# Patient Record
Sex: Female | Born: 1975 | Race: White | Hispanic: No | Marital: Married | Smoking: Never smoker
Health system: Southern US, Community
[De-identification: ages and names within clinical notes are randomized; demographics above are authoritative.]

## PROBLEM LIST (undated history)

## (undated) DIAGNOSIS — E039 Hypothyroidism, unspecified: Secondary | ICD-10-CM

## (undated) DIAGNOSIS — F909 Attention-deficit hyperactivity disorder, unspecified type: Secondary | ICD-10-CM

## (undated) DIAGNOSIS — G43909 Migraine, unspecified, not intractable, without status migrainosus: Secondary | ICD-10-CM

## (undated) DIAGNOSIS — D509 Iron deficiency anemia, unspecified: Secondary | ICD-10-CM

## (undated) DIAGNOSIS — Z8659 Personal history of other mental and behavioral disorders: Secondary | ICD-10-CM

## (undated) DIAGNOSIS — D039 Melanoma in situ, unspecified: Secondary | ICD-10-CM

## (undated) DIAGNOSIS — R519 Headache, unspecified: Secondary | ICD-10-CM

## (undated) DIAGNOSIS — O021 Missed abortion: Secondary | ICD-10-CM

## (undated) DIAGNOSIS — K219 Gastro-esophageal reflux disease without esophagitis: Secondary | ICD-10-CM

## (undated) DIAGNOSIS — Z8619 Personal history of other infectious and parasitic diseases: Secondary | ICD-10-CM

## (undated) DIAGNOSIS — R51 Headache: Secondary | ICD-10-CM

## (undated) HISTORY — DX: Personal history of other mental and behavioral disorders: Z86.59

## (undated) HISTORY — PX: WISDOM TOOTH EXTRACTION: SHX21

## (undated) HISTORY — DX: Iron deficiency anemia, unspecified: D50.9

## (undated) HISTORY — DX: Gastro-esophageal reflux disease without esophagitis: K21.9

## (undated) HISTORY — DX: Headache, unspecified: R51.9

## (undated) HISTORY — PX: RHINOPLASTY: SUR1284

## (undated) HISTORY — DX: Missed abortion: O02.1

## (undated) HISTORY — DX: Attention-deficit hyperactivity disorder, unspecified type: F90.9

## (undated) HISTORY — DX: Melanoma in situ, unspecified: D03.9

## (undated) HISTORY — DX: Hypothyroidism, unspecified: E03.9

## (undated) HISTORY — DX: Personal history of other infectious and parasitic diseases: Z86.19

## (undated) HISTORY — PX: TONSILLECTOMY AND ADENOIDECTOMY: SUR1326

## (undated) HISTORY — DX: Migraine, unspecified, not intractable, without status migrainosus: G43.909

## (undated) HISTORY — PX: PLACEMENT OF BREAST IMPLANTS: SHX6334

## (undated) HISTORY — DX: Headache: R51

---
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
## 2004-11-23 HISTORY — PX: AUGMENTATION MAMMAPLASTY: SUR837

## 2006-11-23 DIAGNOSIS — D039 Melanoma in situ, unspecified: Secondary | ICD-10-CM

## 2006-11-23 HISTORY — DX: Melanoma in situ, unspecified: D03.9

## 2011-11-24 LAB — HM MAMMOGRAPHY

## 2013-12-05 LAB — BASIC METABOLIC PANEL
BUN: 23 mg/dL — AB (ref 4–21)
CREATININE: 0.8 mg/dL (ref 0.5–1.1)
GLUCOSE: 88 mg/dL
POTASSIUM: 3.9 mmol/L (ref 3.4–5.3)
Sodium: 139 mmol/L (ref 137–147)

## 2013-12-05 LAB — HEPATIC FUNCTION PANEL
ALT: 18 U/L (ref 7–35)
AST: 8 U/L — AB (ref 13–35)
Alkaline Phosphatase: 40 U/L (ref 25–125)
Bilirubin, Total: 0.4 mg/dL

## 2014-03-05 LAB — HM PAP SMEAR

## 2014-03-08 ENCOUNTER — Encounter: Payer: Self-pay | Admitting: Internal Medicine

## 2014-03-30 ENCOUNTER — Encounter: Payer: Self-pay | Admitting: *Deleted

## 2014-04-03 ENCOUNTER — Other Ambulatory Visit: Payer: Self-pay | Admitting: Internal Medicine

## 2014-04-03 DIAGNOSIS — Z808 Family history of malignant neoplasm of other organs or systems: Secondary | ICD-10-CM

## 2014-04-03 DIAGNOSIS — E063 Autoimmune thyroiditis: Secondary | ICD-10-CM

## 2014-04-03 DIAGNOSIS — E049 Nontoxic goiter, unspecified: Secondary | ICD-10-CM

## 2014-04-05 ENCOUNTER — Ambulatory Visit
Admission: RE | Admit: 2014-04-05 | Discharge: 2014-04-05 | Disposition: A | Discharge status: Home / Self Care | Payer: 59 | Source: Ambulatory Visit | Attending: Internal Medicine | Admitting: Internal Medicine

## 2014-04-05 DIAGNOSIS — Z808 Family history of malignant neoplasm of other organs or systems: Secondary | ICD-10-CM

## 2014-04-05 DIAGNOSIS — E063 Autoimmune thyroiditis: Secondary | ICD-10-CM

## 2014-04-05 DIAGNOSIS — E049 Nontoxic goiter, unspecified: Secondary | ICD-10-CM

## 2014-04-10 ENCOUNTER — Other Ambulatory Visit: Payer: Self-pay | Admitting: Internal Medicine

## 2014-04-10 DIAGNOSIS — E041 Nontoxic single thyroid nodule: Secondary | ICD-10-CM

## 2014-04-17 ENCOUNTER — Other Ambulatory Visit: Payer: 59

## 2014-04-24 ENCOUNTER — Ambulatory Visit
Admission: RE | Admit: 2014-04-24 | Discharge: 2014-04-24 | Disposition: A | Discharge status: Home / Self Care | Payer: 59 | Source: Ambulatory Visit | Attending: Internal Medicine | Admitting: Internal Medicine

## 2014-04-24 ENCOUNTER — Other Ambulatory Visit (HOSPITAL_COMMUNITY)
Admission: RE | Admit: 2014-04-24 | Discharge: 2014-04-24 | Disposition: A | Discharge status: Home / Self Care | Payer: 59 | Source: Ambulatory Visit | Attending: Interventional Radiology | Admitting: Interventional Radiology

## 2014-04-24 DIAGNOSIS — E041 Nontoxic single thyroid nodule: Secondary | ICD-10-CM | POA: Insufficient documentation

## 2014-05-11 ENCOUNTER — Ambulatory Visit (INDEPENDENT_AMBULATORY_CARE_PROVIDER_SITE_OTHER): Payer: 59 | Admitting: Internal Medicine

## 2014-05-11 ENCOUNTER — Encounter: Payer: Self-pay | Admitting: Internal Medicine

## 2014-05-11 VITALS — BP 90/68 | HR 72 | Ht 65.0 in | Wt 132.2 lb

## 2014-05-11 DIAGNOSIS — K648 Other hemorrhoids: Secondary | ICD-10-CM

## 2014-05-11 DIAGNOSIS — K625 Hemorrhage of anus and rectum: Secondary | ICD-10-CM

## 2014-05-11 MED ORDER — HYDROCORTISONE ACETATE 25 MG RE SUPP: 25.0000 mg | Freq: Every day | RECTAL | Status: DC

## 2014-05-11 NOTE — Progress Notes (Signed)
Joanna Thompson May 15, 1976 [id number]  Note: This dictation was prepared with Dragon digital system. Any transcriptional errors that result from this procedure are unintentional.   History of Present Illness:  This is a 38 year old white female with multiple episodes of painless rectal bleeding which started several weeks ago recently  while having a bowel movement. Her bowel habits have always been  irregular. She does not take laxatives. She has hemorrhoids which at times prolapse. This started during her pregnancy 6 years ago. She was told to be anemic but does not know her hemoglobin value. There is a family history of colon cancer in her maternal grandmother. She has Hashimoto's thyroiditis followed by Dr.Kerr.     Past Medical History  Diagnosis Date  . H/O bulimia nervosa   . Hypothyroidism   . Missed abortion   . Generalized headaches   . Anemia, iron deficiency     Past Surgical History  Procedure Laterality Date  . Placement of breast implants    . Rhinoplasty    . Tonsillectomy and adenoidectomy      Allergies  Allergen Reactions  . Analgesia [Trolamine]   . Betadine [Povidone Iodine]   . Codeine     Family history and social history have been reviewed.  Review of Systems:   The remainder of the 10 point ROS is negative except as outlined in the H&P  Physical Exam: General Appearance Well developed, in no distress Eyes  Non icteric  HEENT  Non traumatic, normocephalic  Mouth No lesion, tongue papillated, no cheilosis Neck Supple without adenopathy, thyroid not enlarged, no carotid bruits, no JVD Lungs Clear to auscultation bilaterally COR Normal S1, normal S2, regular rhythm, no murmur, quiet precordium Abdomen Mildly tender in epigastrium. Normal active bowel sounds.  Rectal Anoscopic exam reveals large internal hemorrhoids. Erythema and hyperemia, but no active bleeding. There was no visible prolapse. External exam is unremarkable, stool is Hemoccult  negative.  Extremities  No pedal edema Skin No lesions Neurological Alert and oriented x 3 Psychological Normal mood and affect  Assessment and Plan:   Problem #41 38 year old white female with first-degree internal hemorrhoids which are most likely the source of the painless bleeding. We have discussed colonoscopy versus empirical treatment of hemorrhoids followed by re-examination. She prefers to be treated first with Anusol-HC suppositories, one at bedtime. She will follow a high-fiber diet and return for an appointment in 6-8 weeks. If bleeding continues, we will consider colonoscopy.    Delfin Edis 05/11/2014

## 2014-05-11 NOTE — Patient Instructions (Signed)
Please purchase Metamucil over the counter. Take as directed. Anusol HC- 1 suppository into rectum every night  Please follow up with Dr Olevia Perches on Tuesday August 18th at 9:45 am.  Please follow a high fiber diet.  High-Fiber Diet Fiber is found in fruits, vegetables, and grains. A high-fiber diet encourages the addition of more whole grains, legumes, fruits, and vegetables in your diet. The recommended amount of fiber for adult males is 38 g per day. For adult females, it is 25 g per day. Pregnant and lactating women should get 28 g of fiber per day. If you have a digestive or bowel problem, ask your caregiver for advice before adding high-fiber foods to your diet. Eat a variety of high-fiber foods instead of only a select few type of foods.  PURPOSE  To increase stool bulk.  To make bowel movements more regular to prevent constipation.  To lower cholesterol.  To prevent overeating. WHEN IS THIS DIET USED?  It may be used if you have constipation and hemorrhoids.  It may be used if you have uncomplicated diverticulosis (intestine condition) and irritable bowel syndrome.  It may be used if you need help with weight management.  It may be used if you want to add it to your diet as a protective measure against atherosclerosis, diabetes, and cancer. SOURCES OF FIBER  Whole-grain breads and cereals.  Fruits, such as apples, oranges, bananas, berries, prunes, and pears.  Vegetables, such as green peas, carrots, sweet potatoes, beets, broccoli, cabbage, spinach, and artichokes.  Legumes, such split peas, soy, lentils.  Almonds. FIBER CONTENT IN FOODS Starches and Grains / Dietary Fiber (g)  Cheerios, 1 cup / 3 g  Corn Flakes cereal, 1 cup / 0.7 g  Rice crispy treat cereal, 1 cup / 0.3 g  Instant oatmeal (cooked),  cup / 2 g  Frosted wheat cereal, 1 cup / 5.1 g  Brown, long-grain rice (cooked), 1 cup / 3.5 g  White, long-grain rice (cooked), 1 cup / 0.6 g  Enriched  macaroni (cooked), 1 cup / 2.5 g Legumes / Dietary Fiber (g)  Baked beans (canned, plain, or vegetarian),  cup / 5.2 g  Kidney beans (canned),  cup / 6.8 g  Pinto beans (cooked),  cup / 5.5 g Breads and Crackers / Dietary Fiber (g)  Plain or honey graham crackers, 2 squares / 0.7 g  Saltine crackers, 3 squares / 0.3 g  Plain, salted pretzels, 10 pieces / 1.8 g  Whole-wheat bread, 1 slice / 1.9 g  White bread, 1 slice / 0.7 g  Raisin bread, 1 slice / 1.2 g  Plain bagel, 3 oz / 2 g  Flour tortilla, 1 oz / 0.9 g  Corn tortilla, 1 small / 1.5 g  Hamburger or hotdog bun, 1 small / 0.9 g Fruits / Dietary Fiber (g)  Apple with skin, 1 medium / 4.4 g  Sweetened applesauce,  cup / 1.5 g  Banana,  medium / 1.5 g  Grapes, 10 grapes / 0.4 g  Orange, 1 small / 2.3 g  Raisin, 1.5 oz / 1.6 g  Melon, 1 cup / 1.4 g Vegetables / Dietary Fiber (g)  Green beans (canned),  cup / 1.3 g  Carrots (cooked),  cup / 2.3 g  Broccoli (cooked),  cup / 2.8 g  Peas (cooked),  cup / 4.4 g  Mashed potatoes,  cup / 1.6 g  Lettuce, 1 cup / 0.5 g  Corn (canned),  cup / 1.6  g  Tomato,  cup / 1.1 g Document Released: 11/09/2005 Document Revised: 05/10/2012 Document Reviewed: 02/11/2012 [facility] Patient Information 2015 [place], [place]. This information is not intended to replace advice given to you by your health care provider. Make sure you discuss any questions you have with your health care provider.  CC:Dr Dellis Filbert

## 2014-07-10 ENCOUNTER — Ambulatory Visit: Payer: 59 | Admitting: Internal Medicine

## 2014-07-17 ENCOUNTER — Ambulatory Visit (INDEPENDENT_AMBULATORY_CARE_PROVIDER_SITE_OTHER): Payer: 59 | Admitting: Internal Medicine

## 2014-07-17 ENCOUNTER — Other Ambulatory Visit (INDEPENDENT_AMBULATORY_CARE_PROVIDER_SITE_OTHER): Payer: 59

## 2014-07-17 ENCOUNTER — Encounter: Payer: Self-pay | Admitting: Internal Medicine

## 2014-07-17 VITALS — BP 100/62 | HR 66 | Temp 98.0°F | Resp 16 | Ht 65.0 in | Wt 133.0 lb

## 2014-07-17 DIAGNOSIS — E063 Autoimmune thyroiditis: Secondary | ICD-10-CM | POA: Insufficient documentation

## 2014-07-17 DIAGNOSIS — F909 Attention-deficit hyperactivity disorder, unspecified type: Secondary | ICD-10-CM | POA: Insufficient documentation

## 2014-07-17 DIAGNOSIS — Z0000 Encounter for general adult medical examination without abnormal findings: Secondary | ICD-10-CM

## 2014-07-17 DIAGNOSIS — R1011 Right upper quadrant pain: Secondary | ICD-10-CM

## 2014-07-17 DIAGNOSIS — K219 Gastro-esophageal reflux disease without esophagitis: Secondary | ICD-10-CM | POA: Insufficient documentation

## 2014-07-17 DIAGNOSIS — G43909 Migraine, unspecified, not intractable, without status migrainosus: Secondary | ICD-10-CM | POA: Insufficient documentation

## 2014-07-17 DIAGNOSIS — E039 Hypothyroidism, unspecified: Secondary | ICD-10-CM

## 2014-07-17 LAB — URINALYSIS, ROUTINE W REFLEX MICROSCOPIC
Bilirubin Urine: NEGATIVE
Hgb urine dipstick: NEGATIVE
Ketones, ur: NEGATIVE
LEUKOCYTES UA: NEGATIVE
NITRITE: NEGATIVE
PH: 5.5 (ref 5.0–8.0)
SPECIFIC GRAVITY, URINE: 1.01 (ref 1.000–1.030)
Total Protein, Urine: NEGATIVE
UROBILINOGEN UA: 0.2 (ref 0.0–1.0)
Urine Glucose: NEGATIVE

## 2014-07-17 LAB — CBC WITH DIFFERENTIAL/PLATELET
BASOS PCT: 1.5 % (ref 0.0–3.0)
Basophils Absolute: 0.1 10*3/uL (ref 0.0–0.1)
Eosinophils Absolute: 0.3 10*3/uL (ref 0.0–0.7)
Eosinophils Relative: 4.8 % (ref 0.0–5.0)
HCT: 42.7 % (ref 36.0–46.0)
HEMOGLOBIN: 14.2 g/dL (ref 12.0–15.0)
Lymphocytes Relative: 22.5 % (ref 12.0–46.0)
Lymphs Abs: 1.3 10*3/uL (ref 0.7–4.0)
MCHC: 33.4 g/dL (ref 30.0–36.0)
MCV: 90.9 fl (ref 78.0–100.0)
MONOS PCT: 7.7 % (ref 3.0–12.0)
Monocytes Absolute: 0.4 10*3/uL (ref 0.1–1.0)
NEUTROS ABS: 3.7 10*3/uL (ref 1.4–7.7)
Neutrophils Relative %: 63.5 % (ref 43.0–77.0)
Platelets: 190 10*3/uL (ref 150.0–400.0)
RBC: 4.7 Mil/uL (ref 3.87–5.11)
RDW: 13.1 % (ref 11.5–15.5)
WBC: 5.8 10*3/uL (ref 4.0–10.5)

## 2014-07-17 LAB — BASIC METABOLIC PANEL
BUN: 18 mg/dL (ref 6–23)
CO2: 29 meq/L (ref 19–32)
Calcium: 9 mg/dL (ref 8.4–10.5)
Chloride: 104 mEq/L (ref 96–112)
Creatinine, Ser: 1 mg/dL (ref 0.4–1.2)
GFR: 70.18 mL/min (ref >60.00)
Glucose, Bld: 58 mg/dL — ABNORMAL LOW (ref 70–99)
POTASSIUM: 4.3 meq/L (ref 3.5–5.1)
SODIUM: 139 meq/L (ref 135–145)

## 2014-07-17 LAB — HEPATIC FUNCTION PANEL
ALBUMIN: 4.1 g/dL (ref 3.5–5.2)
ALK PHOS: 42 U/L (ref 39–117)
ALT: 13 U/L (ref 0–35)
AST: 19 U/L (ref 0–37)
Bilirubin, Direct: 0.1 mg/dL (ref 0.0–0.3)
TOTAL PROTEIN: 6.9 g/dL (ref 6.0–8.3)
Total Bilirubin: 0.6 mg/dL (ref 0.2–1.2)

## 2014-07-17 LAB — VITAMIN D 25 HYDROXY (VIT D DEFICIENCY, FRACTURES): VITD: 52.71 ng/mL (ref 30.00–100.00)

## 2014-07-17 LAB — LIPID PANEL
CHOLESTEROL: 168 mg/dL (ref 0–200)
HDL: 64.1 mg/dL (ref >39.00)
LDL Cholesterol: 84 mg/dL (ref 0–99)
NONHDL: 103.9
Total CHOL/HDL Ratio: 3
Triglycerides: 98 mg/dL (ref 0.0–149.0)
VLDL: 19.6 mg/dL (ref 0.0–40.0)

## 2014-07-17 LAB — TSH: TSH: 2.04 u[IU]/mL (ref 0.35–4.50)

## 2014-07-17 NOTE — Progress Notes (Signed)
Subjective:    Patient ID: Joanna Thompson, female    DOB: [birth year], 38 y.o.   MRN: [id number]  HPI  New patient to me, here to establish with primary care physician patient is here today for annual physical. Patient feels well overall.  Reviewed chronic medical issues and interval medical events  Past Medical History  Diagnosis Date  . H/O bulimia nervosa   . Hypothyroidism   . Missed abortion   . Generalized headaches   . Anemia, iron deficiency   . GERD (gastroesophageal reflux disease)   . H/O bulimia nervosa   . History of chicken pox 1984  . Migraine   . ADHD (attention deficit hyperactivity disorder)   . Melanoma in situ 2008    anterior abd - follows with derm annually   Family History  Problem Relation Age of Onset  . Colon cancer Maternal Grandmother 38  . Thyroid cancer Father   . Colon polyps Paternal Grandmother    History  Substance Use Topics  . Smoking status: Never Smoker   . Smokeless tobacco: Never Used  . Alcohol Use: Yes    Review of Systems  Constitutional: Negative for fatigue and unexpected weight change.  Respiratory: Negative for cough, shortness of breath and wheezing.   Cardiovascular: Negative for chest pain, palpitations and leg swelling.  Gastrointestinal: Positive for abdominal pain (post prandial pain in RUQ >3x/wk). Negative for nausea and diarrhea.  Neurological: Negative for dizziness, weakness, light-headedness and headaches.  Psychiatric/Behavioral: Negative for dysphoric mood. The patient is not nervous/anxious.   All other systems reviewed and are negative.      Objective:   Physical Exam  BP 100/62  Pulse 66  Temp(Src) 98 F (36.7 C) (Oral)  Resp 16  Ht 5\' 5"  (1.651 m)  Wt 133 lb (60.328 kg)  BMI 22.13 kg/m2  SpO2 99% Wt Readings from Last 3 Encounters:  07/17/14 133 lb (60.328 kg)  05/11/14 132 lb 3.2 oz (59.966 kg)   Constitutional: She appears well-developed and well-nourished. No distress.  HENT: Head:  Normocephalic and atraumatic. Ears: B TMs ok, no erythema or effusion; Nose: Nose normal. Mouth/Throat: Oropharynx is clear and moist. No oropharyngeal exudate.  Eyes: Conjunctivae and EOM are normal. Pupils are equal, round, and reactive to light. No scleral icterus.  Neck: Normal range of motion. Neck supple. No JVD present. No thyromegaly present.  Cardiovascular: Normal rate, regular rhythm and normal heart sounds.  No murmur heard. No BLE edema. Pulmonary/Chest: Effort normal and breath sounds normal. No respiratory distress. She has no wheezes.  Abdominal: Soft. Bowel sounds are normal. She exhibits no distension. There is no tenderness. no masses GU/breast: defer to gyn Musculoskeletal: Normal range of motion, no joint effusions. No gross deformities Neurological: She is alert and oriented to person, place, and time. No cranial nerve deficit. Coordination, balance, strength, speech and gait are normal.  Skin: Skin is warm and dry. No rash noted. No erythema.  Psychiatric: She has a normal mood and affect. Her behavior is normal. Judgment and thought content normal.    No results found for this basename: WBC,  HGB,  HCT,  PLT,  GLUCOSE,  CHOL,  TRIG,  HDL,  LDLDIRECT,  LDLCALC,  ALT,  AST,  NA,  K,  CL,  CREATININE,  BUN,  CO2,  TSH,  PSA,  INR,  GLUF,  HGBA1C,  MICROALBUR    US Thyroid Biopsy  04/24/2014   CLINICAL DATA:  38 year old female with a history of Hashimoto thyroiditis  and a dominant right inferior thyroid nodule which meets consensus criteria for ultrasound-guided fine needle aspiration biopsy.  EXAM: ULTRASOUND GUIDED NEEDLE ASPIRATE BIOPSY OF THE THYROID GLAND  COMPARISON:  Prior thyroid ultrasound 04/05/2014  PROCEDURE: Thyroid biopsy was thoroughly discussed with the patient and questions were answered. The benefits, risks, alternatives, and complications were also discussed. The patient understands and wishes to proceed with the procedure. Written consent was obtained.   Ultrasound was performed to localize and mark an adequate site for the biopsy. The patient was then prepped and draped in a normal sterile fashion. Local anesthesia was provided with 1% lidocaine. Using direct ultrasound guidance, 4 passes were made using needles into the nodule within the right lobe of the thyroid. Ultrasound was used to confirm needle placements on all occasions. Specimens were sent to Pathology for analysis.  Complications:  None immediate  FINDINGS: Successful identification of 4.8 cm heterogeneous cystic and solid nodule in the inferior aspect of the right thyroid gland.  IMPRESSION: Ultrasound guided needle aspirate biopsy performed of the right thyroid nodule.  Signed,  Criselda Peaches, MD  [facility]  [facility]   Electronically Signed   By: Jacqulynn Cadet M.D.   On: 04/24/2014 11:58       Assessment & Plan:   CPX/v70.0 - Patient has been counseled on age-appropriate routine health concerns for screening and prevention. These are reviewed and up-to-date. Immunizations are up-to-date or declined. Labs ordered and reviewed.  And written right upper quadrant pain. Question biliary colic given family history of same. We'll check LFTs as with CPX labs and arrange for abdominal ultrasound. Continue treatment for GERD and followup with GI as needed if ultrasound unremarkable  Problem List Items Addressed This Visit   Hashimoto's disease     +TPO Ab 11/2013 - confirmed by endo Follows with endo for med mgmt of same Interval reviewed No change recommended      Other Visit Diagnoses   Routine general medical examination at a health care facility    -  Primary    Relevant Orders       Basic metabolic panel       CBC with Differential       Hepatic function panel       Lipid panel       TSH       Urinalysis, Routine w reflex microscopic       Vit D  25 hydroxy (rtn osteoporosis monitoring)    RUQ abdominal pain         Relevant Orders       US Abdomen Complete

## 2014-07-17 NOTE — Patient Instructions (Addendum)
It was good to see you today.  We have reviewed your prior records including labs and tests today  Health Maintenance reviewed - all recommended immunizations and age-appropriate screenings are up-to-date.  Test(s) ordered today. Your results will be released to MyChart (or called to you) after review, usually within 72hours after test completion. If any changes need to be made, you will be notified at that same time.  we'll make referral for abdominal ultrasound. Our office will contact you regarding appointment(s) once made.  Medications reviewed and updated, no changes recommended at this time.  Please schedule followup in 12 months for annual exam and labs, call sooner if problems.  Health Maintenance Adopting a healthy lifestyle and getting preventive care can go a long way to promote health and wellness. Talk with your health care provider about what schedule of regular examinations is right for you. This is a good chance for you to check in with your provider about disease prevention and staying healthy. In between checkups, there are plenty of things you can do on your own. Experts have done a lot of research about which lifestyle changes and preventive measures are most likely to keep you healthy. Ask your health care provider for more information. WEIGHT AND DIET  Eat a healthy diet  Be sure to include plenty of vegetables, fruits, low-fat dairy products, and lean protein.  Do not eat a lot of foods high in solid fats, added sugars, or salt.  Get regular exercise. This is one of the most important things you can do for your health.  Most adults should exercise for at least 150 minutes each week. The exercise should increase your heart rate and make you sweat (moderate-intensity exercise).  Most adults should also do strengthening exercises at least twice a week. This is in addition to the moderate-intensity exercise.  Maintain a healthy weight  Body mass index (BMI) is a  measurement that can be used to identify possible weight problems. It estimates body fat based on height and weight. Your health care provider can help determine your BMI and help you achieve or maintain a healthy weight.  For females 20 years of age and older:   A BMI below 18.5 is considered underweight.  A BMI of 18.5 to 24.9 is normal.  A BMI of 25 to 29.9 is considered overweight.  A BMI of 30 and above is considered obese.  Watch levels of cholesterol and blood lipids  You should start having your blood tested for lipids and cholesterol at 37 years of age, then have this test every 5 years.  You may need to have your cholesterol levels checked more often if:  Your lipid or cholesterol levels are high.  You are older than 37 years of age.  You are at high risk for heart disease.  CANCER SCREENING   Lung Cancer  Lung cancer screening is recommended for adults 55-80 years old who are at high risk for lung cancer because of a history of smoking.  A yearly low-dose CT scan of the lungs is recommended for people who:  Currently smoke.  Have quit within the past 15 years.  Have at least a 30-pack-year history of smoking. A pack year is smoking an average of one pack of cigarettes a day for 1 year.  Yearly screening should continue until it has been 15 years since you quit.  Yearly screening should stop if you develop a health problem that would prevent you from having lung cancer treatment.    Breast Cancer  Practice breast self-awareness. This means understanding how your breasts normally appear and feel.  It also means doing regular breast self-exams. Let your health care provider know about any changes, no matter how small.  If you are in your 20s or 30s, you should have a clinical breast exam (CBE) by a health care provider every 1-3 years as part of a regular health exam.  If you are 13 or older, have a CBE every year. Also consider having a breast X-ray  (mammogram) every year.  If you have a family history of breast cancer, talk to your health care provider about genetic screening.  If you are at high risk for breast cancer, talk to your health care provider about having an MRI and a mammogram every year.  Breast cancer gene (BRCA) assessment is recommended for women who have family members with BRCA-related cancers. BRCA-related cancers include:  Breast.  Ovarian.  Tubal.  Peritoneal cancers.  Results of the assessment will determine the need for genetic counseling and BRCA1 and BRCA2 testing. Cervical Cancer Routine pelvic examinations to screen for cervical cancer are no longer recommended for nonpregnant women who are considered low risk for cancer of the pelvic organs (ovaries, uterus, and vagina) and who do not have symptoms. A pelvic examination may be necessary if you have symptoms including those associated with pelvic infections. Ask your health care provider if a screening pelvic exam is right for you.   The Pap test is the screening test for cervical cancer for women who are considered at risk.  If you had a hysterectomy for a problem that was not cancer or a condition that could lead to cancer, then you no longer need Pap tests.  If you are older than 65 years, and you have had normal Pap tests for the past 10 years, you no longer need to have Pap tests.  If you have had past treatment for cervical cancer or a condition that could lead to cancer, you need Pap tests and screening for cancer for at least 20 years after your treatment.  If you no longer get a Pap test, assess your risk factors if they change (such as having a new sexual partner). This can affect whether you should start being screened again.  Some women have medical problems that increase their chance of getting cervical cancer. If this is the case for you, your health care provider may recommend more frequent screening and Pap tests.  The human  papillomavirus (HPV) test is another test that may be used for cervical cancer screening. The HPV test looks for the virus that can cause cell changes in the cervix. The cells collected during the Pap test can be tested for HPV.  The HPV test can be used to screen women 4 years of age and older. Getting tested for HPV can extend the interval between normal Pap tests from three to five years.  An HPV test also should be used to screen women of any age who have unclear Pap test results.  After 37 years of age, women should have HPV testing as often as Pap tests.  Colorectal Cancer  This type of cancer can be detected and often prevented.  Routine colorectal cancer screening usually begins at 37 years of age and continues through 37 years of age.  Your health care provider may recommend screening at an earlier age if you have risk factors for colon cancer.  Your health care provider may also recommend using  home test kits to check for hidden blood in the stool.  A small camera at the end of a tube can be used to examine your colon directly (sigmoidoscopy or colonoscopy). This is done to check for the earliest forms of colorectal cancer.  Routine screening usually begins at age 50.  Direct examination of the colon should be repeated every 5-10 years through 37 years of age. However, you may need to be screened more often if early forms of precancerous polyps or small growths are found. Skin Cancer  Check your skin from head to toe regularly.  Tell your health care provider about any new moles or changes in moles, especially if there is a change in a mole's shape or color.  Also tell your health care provider if you have a mole that is larger than the size of a pencil eraser.  Always use sunscreen. Apply sunscreen liberally and repeatedly throughout the day.  Protect yourself by wearing long sleeves, pants, a wide-brimmed hat, and sunglasses whenever you are outside. HEART DISEASE,  DIABETES, AND HIGH BLOOD PRESSURE   Have your blood pressure checked at least every 1-2 years. High blood pressure causes heart disease and increases the risk of stroke.  If you are between 55 years and 79 years old, ask your health care provider if you should take aspirin to prevent strokes.  Have regular diabetes screenings. This involves taking a blood sample to check your fasting blood sugar level.  If you are at a normal weight and have a low risk for diabetes, have this test once every three years after 37 years of age.  If you are overweight and have a high risk for diabetes, consider being tested at a younger age or more often. PREVENTING INFECTION  Hepatitis B  If you have a higher risk for hepatitis B, you should be screened for this virus. You are considered at high risk for hepatitis B if:  You were born in a country where hepatitis B is common. Ask your health care provider which countries are considered high risk.  Your parents were born in a high-risk country, and you have not been immunized against hepatitis B (hepatitis B vaccine).  You have HIV or AIDS.  You use needles to inject street drugs.  You live with someone who has hepatitis B.  You have had sex with someone who has hepatitis B.  You get hemodialysis treatment.  You take certain medicines for conditions, including cancer, organ transplantation, and autoimmune conditions. Hepatitis C  Blood testing is recommended for:  Everyone born from 1945 through 1965.  Anyone with known risk factors for hepatitis C. Sexually transmitted infections (STIs)  You should be screened for sexually transmitted infections (STIs) including gonorrhea and chlamydia if:  You are sexually active and are younger than 37 years of age.  You are older than 37 years of age and your health care provider tells you that you are at risk for this type of infection.  Your sexual activity has changed since you were last screened and  you are at an increased risk for chlamydia or gonorrhea. Ask your health care provider if you are at risk.  If you do not have HIV, but are at risk, it may be recommended that you take a prescription medicine daily to prevent HIV infection. This is called pre-exposure prophylaxis (PrEP). You are considered at risk if:  You are sexually active and do not regularly use condoms or know the HIV status of your   partner(s).  You take drugs by injection.  You are sexually active with a partner who has HIV. Talk with your health care provider about whether you are at high risk of being infected with HIV. If you choose to begin PrEP, you should first be tested for HIV. You should then be tested every 3 months for as long as you are taking PrEP.  PREGNANCY   If you are premenopausal and you may become pregnant, ask your health care provider about preconception counseling.  If you may become pregnant, take 400 to 800 micrograms (mcg) of folic acid every day.  If you want to prevent pregnancy, talk to your health care provider about birth control (contraception). OSTEOPOROSIS AND MENOPAUSE   Osteoporosis is a disease in which the bones lose minerals and strength with aging. This can result in serious bone fractures. Your risk for osteoporosis can be identified using a bone density scan.  If you are 59 years of age or older, or if you are at risk for osteoporosis and fractures, ask your health care provider if you should be screened.  Ask your health care provider whether you should take a calcium or vitamin D supplement to lower your risk for osteoporosis.  Menopause may have certain physical symptoms and risks.  Hormone replacement therapy may reduce some of these symptoms and risks. Talk to your health care provider about whether hormone replacement therapy is right for you.  HOME CARE INSTRUCTIONS   Schedule regular health, dental, and eye exams.  Stay current with your immunizations.   Do  not use any tobacco products including cigarettes, chewing tobacco, or electronic cigarettes.  If you are pregnant, do not drink alcohol.  If you are breastfeeding, limit how much and how often you drink alcohol.  Limit alcohol intake to no more than 1 drink per day for nonpregnant women. One drink equals 12 ounces of beer, 5 ounces of wine, or 1 ounces of hard liquor.  Do not use street drugs.  Do not share needles.  Ask your health care provider for help if you need support or information about quitting drugs.  Tell your health care provider if you often feel depressed.  Tell your health care provider if you have ever been abused or do not feel safe at home. Document Released: 05/25/2011 Document Revised: 03/26/2014 Document Reviewed: 10/11/2013 [facility] Patient Information 2015 [place], [place]. This information is not intended to replace advice given to you by your health care provider. Make sure you discuss any questions you have with your health care provider.

## 2014-07-17 NOTE — Progress Notes (Signed)
Pre visit review using our clinic review tool, if applicable. No additional management support is needed unless otherwise documented below in the visit note. 

## 2014-07-17 NOTE — Assessment & Plan Note (Signed)
+  TPO Ab 11/2013 - confirmed by endo Follows with endo for med mgmt of same Interval reviewed No change recommended

## 2014-07-24 ENCOUNTER — Ambulatory Visit
Admission: RE | Admit: 2014-07-24 | Discharge: 2014-07-24 | Disposition: A | Discharge status: Home / Self Care | Payer: 59 | Source: Ambulatory Visit | Attending: Internal Medicine | Admitting: Internal Medicine

## 2014-07-24 ENCOUNTER — Ambulatory Visit: Payer: Self-pay | Admitting: Internal Medicine

## 2014-07-24 DIAGNOSIS — R1011 Right upper quadrant pain: Secondary | ICD-10-CM

## 2014-08-06 ENCOUNTER — Telehealth: Payer: Self-pay | Admitting: Internal Medicine

## 2014-08-07 NOTE — Telephone Encounter (Signed)
Spoke with patient and she states for the last 2 weeks, she has had urgency and frequent bowel movements. She states she has the urge for a bowel movement urgently then only has a small stool. States this occurs up to 15 times/day. She reports she has hemorrhoids which are bleeding due to frequent BM's. Scheduled with Tye Savoy, NP on 08/10/14 at 1:30 PM.

## 2014-08-07 NOTE — Telephone Encounter (Signed)
Left a message for patient to call back. 

## 2014-08-09 ENCOUNTER — Encounter: Payer: Self-pay | Admitting: Nurse Practitioner

## 2014-08-09 ENCOUNTER — Ambulatory Visit (INDEPENDENT_AMBULATORY_CARE_PROVIDER_SITE_OTHER): Payer: 59 | Admitting: Nurse Practitioner

## 2014-08-09 ENCOUNTER — Other Ambulatory Visit (INDEPENDENT_AMBULATORY_CARE_PROVIDER_SITE_OTHER): Payer: 59

## 2014-08-09 VITALS — BP 90/60 | HR 72 | Ht 64.75 in | Wt 135.2 lb

## 2014-08-09 DIAGNOSIS — K625 Hemorrhage of anus and rectum: Secondary | ICD-10-CM

## 2014-08-09 DIAGNOSIS — K6289 Other specified diseases of anus and rectum: Secondary | ICD-10-CM

## 2014-08-09 LAB — CBC WITH DIFFERENTIAL/PLATELET
BASOS PCT: 0.1 % (ref 0.0–3.0)
Basophils Absolute: 0 10*3/uL (ref 0.0–0.1)
EOS PCT: 0.9 % (ref 0.0–5.0)
Eosinophils Absolute: 0.1 10*3/uL (ref 0.0–0.7)
HEMATOCRIT: 38.9 % (ref 36.0–46.0)
Hemoglobin: 13.2 g/dL (ref 12.0–15.0)
LYMPHS ABS: 0.8 10*3/uL (ref 0.7–4.0)
Lymphocytes Relative: 7.8 % — ABNORMAL LOW (ref 12.0–46.0)
MCHC: 34 g/dL (ref 30.0–36.0)
MCV: 90 fl (ref 78.0–100.0)
Monocytes Absolute: 0.6 10*3/uL (ref 0.1–1.0)
Monocytes Relative: 5.6 % (ref 3.0–12.0)
Neutro Abs: 8.8 10*3/uL — ABNORMAL HIGH (ref 1.4–7.7)
PLATELETS: 176 10*3/uL (ref 150.0–400.0)
RBC: 4.32 Mil/uL (ref 3.87–5.11)
RDW: 13.1 % (ref 11.5–15.5)
WBC: 10.3 10*3/uL (ref 4.0–10.5)

## 2014-08-09 LAB — TSH: TSH: 2.41 u[IU]/mL (ref 0.35–4.50)

## 2014-08-09 MED ORDER — NA SULFATE-K SULFATE-MG SULF 17.5-3.13-1.6 GM/177ML PO SOLN: 1.0000 | Freq: Once | ORAL | Status: DC

## 2014-08-09 NOTE — Progress Notes (Signed)
     History of Present Illness:  Patient is a 38 year old female who was seen by Dr. Olevia Perches late June for rectal bleeding. Large internal hemorrhoids were seen on anoscopy, a course of Anusol suppositories was prescribed. Rectal bleeding did not improve so several days later patient used another 10 days of steroid suppositories. Patient presents today with persistent bleeding as well as urgency, severe rectal pain / pressure. She has the urge to defecate approximately 20 times a day but passes a small amount of formed stool only once or twice a day. Patient has made no dietary changes. No medication changes except that she stopped her thyroid medication last week to see if it would help her GI symptoms.  Current Medications, Allergies, Past Medical History, Past Surgical History, Family History and Social History were reviewed in [employer] record.   Physical Exam: General: Pleasant, well developed , white female in no acute distress Head: Normocephalic and atraumatic Eyes:  sclerae anicteric, conjunctiva pink  Ears: Normal auditory acuity Lungs: Clear throughout to auscultation Heart: Regular rate and rhythm Abdomen: Soft, non distended, non-tender. No masses, no hepatomegaly. Normal bowel sounds Rectal: Rectal discomfort with DRE. Internal hemorrhoids seen on anoscopy Musculoskeletal: Symmetrical with no gross deformities  Extremities: No edema  Neurological: Alert oriented x 4, grossly nonfocal Psychological:  Alert and cooperative. Normal mood and affect  Assessment and Recommendations:  38 year old female with at least a 3 month history of rectal bleeding, now associated with significant rectal pain / pressure and urgency without diarrhea. Passing small amounts of formed stool though she has urge to defecate about 20 times a day. Patient does have internal hemorrhoids though they don't usually cause this degree of problems. Rule out colitis / proctitis. For  further evaluation patient will be scheduled for colonoscopy. The risks, benefits, and alternatives to colonoscopy with possible biopsy and possible polypectomy were discussed with the patient and she consents to proceed. No need for third round of steroid suppositories at this point.

## 2014-08-09 NOTE — Patient Instructions (Addendum)
  Please go to the basement level to have your labs drawn.  You have been scheduled for a colonoscopy. Please follow written instructions given to you at your visit today.   If you use inhalers (even only as needed), please bring them with you on the day of your procedure. Your physician has requested that you go to www.[email or web address].com and enter the access code given to you at your visit today. This web site gives a general overview about your procedure. However, you should still follow specific instructions given to you by our office regarding your preparation for the procedure.

## 2014-08-10 ENCOUNTER — Encounter: Payer: Self-pay | Admitting: Nurse Practitioner

## 2014-08-10 DIAGNOSIS — K625 Hemorrhage of anus and rectum: Secondary | ICD-10-CM | POA: Insufficient documentation

## 2014-08-10 DIAGNOSIS — K6289 Other specified diseases of anus and rectum: Secondary | ICD-10-CM | POA: Insufficient documentation

## 2014-08-10 NOTE — Progress Notes (Signed)
Reviewed and agree, colonoscopy first to r/o proctitis

## 2014-09-05 ENCOUNTER — Encounter: Payer: Self-pay | Admitting: Internal Medicine

## 2014-09-05 ENCOUNTER — Ambulatory Visit (AMBULATORY_SURGERY_CENTER): Payer: 59 | Admitting: Internal Medicine

## 2014-09-05 VITALS — BP 110/69 | HR 58 | Temp 97.4°F | Resp 17 | Ht 60.5 in | Wt 135.0 lb

## 2014-09-05 DIAGNOSIS — D128 Benign neoplasm of rectum: Secondary | ICD-10-CM

## 2014-09-05 DIAGNOSIS — K640 First degree hemorrhoids: Secondary | ICD-10-CM

## 2014-09-05 DIAGNOSIS — K625 Hemorrhage of anus and rectum: Secondary | ICD-10-CM

## 2014-09-05 DIAGNOSIS — K621 Rectal polyp: Secondary | ICD-10-CM

## 2014-09-05 MED ORDER — HYDROCORTISONE ACETATE 25 MG RE SUPP: 25.0000 mg | Freq: Every evening | RECTAL | Status: DC | PRN

## 2014-09-05 MED ORDER — SODIUM CHLORIDE 0.9 % IV SOLN: 500.0000 mL | INTRAVENOUS | Status: DC

## 2014-09-05 NOTE — Op Note (Signed)
[place]  [address] [place], [id number]   COLONOSCOPY PROCEDURE REPORT  PATIENT: [place], Parish  MR#: [id number] BIRTHDATE: December 14, 1975 , 38  yrs. old GENDER: female ENDOSCOPIST: Lafayette Dragon, MD REFERRED [id number] Asa Lente, M.D. PROCEDURE DATE:  09/05/2014 PROCEDURE:   Colonoscopy with snare polypectomy First Screening Colonoscopy - Avg.  risk and is 50 yrs.  old or older Yes.  Prior Negative Screening - Now for repeat screening. N/A  History of Adenoma - Now for follow-up colonoscopy & has been > or = to 3 yrs.  N/A  Polyps Removed Today? Yes. ASA CLASS:   Class I INDICATIONS:anal bleeding and hematochezia. MEDICATIONS: Monitored anesthesia care and Propofol 300 mg IV  DESCRIPTION OF PROCEDURE:   After the risks benefits and alternatives of the procedure were thoroughly explained, informed consent was obtained.  The digital rectal exam revealed no abnormalities of the rectum.   The LB PCF Q180 [id number]  endoscope was introduced through the anus and advanced to the cecum, which was identified by both the appendix and ileocecal valve. No adverse events experienced.   The quality of the prep was Moviprep fair The instrument was then slowly withdrawn as the colon was fully examined.      COLON FINDINGS: Three smooth sessile polyps measuring 9 mm in size were found in the rectum.  A polypectomy was performed with a cold snare.  The resection was complete, the polyp tissue was partially retrieved and sent to histology.   Moderate sized internal Grade I hemorrhoids were found.  Retroflexed views revealed no abnormalities. The time to cecum=7 minutes 50 seconds.  Withdrawal time=16 minutes 02 seconds.  The scope was withdrawn and the procedure completed. COMPLICATIONS: There were no immediate complications.  ENDOSCOPIC IMPRESSION: 1.   Three sessile polyps were found in the rectum; polypectomy was performed with a cold snare 2.   Moderate sized  internal Grade I hemorrhoids ,most likely source of bleeding  RECOMMENDATIONS: 1.  Await pathology results 2.  Anusol-HC suppositories Consider hemorrhoidal band ligation Recall colonoscopy pending path report  eSigned:  Lafayette Dragon, MD 09/05/2014 10:10 AM   cc:   PATIENT NAME:  Joanna Thompson, Joanna Thompson MR#: [id number]

## 2014-09-05 NOTE — Patient Instructions (Addendum)
YOU HAD AN ENDOSCOPIC PROCEDURE TODAY AT THE [facility]: Refer to the procedure report that was given to you for any specific questions about what was found during the examination.  If the procedure report does not answer your questions, please call your gastroenterologist to clarify.  If you requested that your care partner not be given the details of your procedure findings, then the procedure report has been included in a sealed envelope for you to review at your convenience later.  YOU SHOULD EXPECT: Some feelings of bloating in the abdomen. Passage of more gas than usual.  Walking can help get rid of the air that was put into your GI tract during the procedure and reduce the bloating. If you had a lower endoscopy (such as a colonoscopy or flexible sigmoidoscopy) you may notice spotting of blood in your stool or on the toilet paper. If you underwent a bowel prep for your procedure, then you may not have a normal bowel movement for a few days.  DIET: Your first meal following the procedure should be a light meal and then it is ok to progress to your normal diet.  A half-sandwich or bowl of soup is an example of a good first meal.  Heavy or fried foods are harder to digest and may make you feel nauseous or bloated.  Likewise meals heavy in dairy and vegetables can cause extra gas to form and this can also increase the bloating.  Drink plenty of fluids but you should avoid alcoholic beverages for 24 hours.  ACTIVITY: Your care partner should take you home directly after the procedure.  You should plan to take it easy, moving slowly for the rest of the day.  You can resume normal activity the day after the procedure however you should NOT DRIVE or use heavy machinery for 24 hours (because of the sedation medicines used during the test).    SYMPTOMS TO REPORT IMMEDIATELY: A gastroenterologist can be reached at any hour.  During normal business hours, 8:30 AM to 5:00 PM Monday through Friday,  call [phone].  After hours and on weekends, please call the GI answering service at [phone] who will take a message and have the physician on call contact you.   Following lower endoscopy (colonoscopy or flexible sigmoidoscopy):  Excessive amounts of blood in the stool  Significant tenderness or worsening of abdominal pains  Swelling of the abdomen that is new, acute  Fever of 100F or higher  Following upper endoscopy (EGD)  Vomiting of blood or coffee ground material  New chest pain or pain under the shoulder blades  Painful or persistently difficult swallowing  New shortness of breath  Fever of 100F or higher  Black, tarry-looking stools  FOLLOW UP: If any biopsies were taken you will be contacted by phone or by letter within the next 1-3 weeks.  Call your gastroenterologist if you have not heard about the biopsies in 3 weeks.  Our staff will call the home number listed on your records the next business day following your procedure to check on you and address any questions or concerns that you may have at that time regarding the information given to you following your procedure. This is a courtesy call and so if there is no answer at the home number and we have not heard from you through the emergency physician on call, we will assume that you have returned to your regular daily activities without incident.  SIGNATURES/CONFIDENTIALITY: You and/or your care   partner have signed paperwork which will be entered into your electronic medical record.  These signatures attest to the fact that that the information above on your After Visit Summary has been reviewed and is understood.  Full responsibility of the confidentiality of this discharge information lies with you and/or your care-partner.    Information on polyps and hemorrhoids given to you today  Anusol-HC suppositories Rx given to you by Dr. Olevia Perches

## 2014-09-05 NOTE — Progress Notes (Signed)
Called to room to assist during endoscopic procedure.  Patient ID and intended procedure confirmed with present staff. Received instructions for my participation in the procedure from the performing physician.  

## 2014-09-05 NOTE — Progress Notes (Signed)
Report to PACU, RN, vss, BBS= Clear.  

## 2014-09-06 ENCOUNTER — Telehealth: Payer: Self-pay

## 2014-09-06 NOTE — Telephone Encounter (Signed)
  Follow up Call-  Call back number 09/05/2014  Post procedure Call Back phone  # [phone]  Permission to leave phone message Yes     Patient questions:  Do you have a fever, pain , or abdominal swelling? No. Pain Score  0 *  Have you tolerated food without any problems? No.  Have you been able to return to your normal activities? Yes.    Do you have any questions about your discharge instructions: Diet   No. Medications  No. Follow up visit  No.  Do you have questions or concerns about your Care? No.  Actions: * If pain score is 4 or above: No action needed, pain <4.  Per the pt, she had nausea yesterday.  She said she is prone to have nausea.  She has problems with motion sickness also.  Pt said she ate a bowel of chicken noodle soup and vomited it up. Pt said she drank ginger-ale with out the fizz yesterday ok. Has only had coffee this am.  So far it   has stayed down.  I advised the pt to call if her nausea continues.  To avoid fried, fatty and spicy foods today and see how she tolerates a more bland diet today.  Pt said she would call back if needed. maw

## 2014-09-10 ENCOUNTER — Encounter: Payer: Self-pay | Admitting: Internal Medicine

## 2015-01-08 ENCOUNTER — Other Ambulatory Visit: Payer: Self-pay | Admitting: Internal Medicine

## 2015-01-08 DIAGNOSIS — E041 Nontoxic single thyroid nodule: Secondary | ICD-10-CM

## 2015-03-05 ENCOUNTER — Emergency Department (HOSPITAL_COMMUNITY)
Admission: EM | Admit: 2015-03-05 | Discharge: 2015-03-05 | Disposition: A | Discharge status: Home / Self Care | Payer: 59 | Attending: Emergency Medicine | Admitting: Emergency Medicine

## 2015-03-05 ENCOUNTER — Encounter (HOSPITAL_COMMUNITY): Payer: Self-pay | Admitting: *Deleted

## 2015-03-05 DIAGNOSIS — Z8679 Personal history of other diseases of the circulatory system: Secondary | ICD-10-CM | POA: Diagnosis not present

## 2015-03-05 DIAGNOSIS — Z8719 Personal history of other diseases of the digestive system: Secondary | ICD-10-CM | POA: Insufficient documentation

## 2015-03-05 DIAGNOSIS — Z862 Personal history of diseases of the blood and blood-forming organs and certain disorders involving the immune mechanism: Secondary | ICD-10-CM | POA: Diagnosis not present

## 2015-03-05 DIAGNOSIS — Z8659 Personal history of other mental and behavioral disorders: Secondary | ICD-10-CM | POA: Insufficient documentation

## 2015-03-05 DIAGNOSIS — Z85820 Personal history of malignant melanoma of skin: Secondary | ICD-10-CM | POA: Insufficient documentation

## 2015-03-05 DIAGNOSIS — Y9389 Activity, other specified: Secondary | ICD-10-CM | POA: Insufficient documentation

## 2015-03-05 DIAGNOSIS — Y9289 Other specified places as the place of occurrence of the external cause: Secondary | ICD-10-CM | POA: Insufficient documentation

## 2015-03-05 DIAGNOSIS — W25XXXA Contact with sharp glass, initial encounter: Secondary | ICD-10-CM | POA: Diagnosis not present

## 2015-03-05 DIAGNOSIS — S61215A Laceration without foreign body of left ring finger without damage to nail, initial encounter: Secondary | ICD-10-CM | POA: Diagnosis not present

## 2015-03-05 DIAGNOSIS — Z79899 Other long term (current) drug therapy: Secondary | ICD-10-CM | POA: Diagnosis not present

## 2015-03-05 DIAGNOSIS — E039 Hypothyroidism, unspecified: Secondary | ICD-10-CM | POA: Insufficient documentation

## 2015-03-05 DIAGNOSIS — Y998 Other external cause status: Secondary | ICD-10-CM | POA: Diagnosis not present

## 2015-03-05 DIAGNOSIS — Z8619 Personal history of other infectious and parasitic diseases: Secondary | ICD-10-CM | POA: Insufficient documentation

## 2015-03-05 DIAGNOSIS — S61219A Laceration without foreign body of unspecified finger without damage to nail, initial encounter: Secondary | ICD-10-CM
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
## 2015-03-05 NOTE — Discharge Instructions (Signed)
Wound has been sealed with dermabond, this will slough off over the next few days. May wash hands and shower normally as Dermabond is waterproof. Return here for any complications.

## 2015-04-12 ENCOUNTER — Ambulatory Visit
Admission: RE | Admit: 2015-04-12 | Discharge: 2015-04-12 | Disposition: A | Discharge status: Home / Self Care | Payer: 59 | Source: Ambulatory Visit | Attending: Internal Medicine | Admitting: Internal Medicine

## 2015-04-12 DIAGNOSIS — E041 Nontoxic single thyroid nodule: Secondary | ICD-10-CM

## 2015-07-22 ENCOUNTER — Encounter: Payer: 59 | Admitting: Internal Medicine

## 2015-09-04 ENCOUNTER — Encounter: Payer: 59 | Admitting: Internal Medicine

## 2015-09-27 ENCOUNTER — Other Ambulatory Visit (INDEPENDENT_AMBULATORY_CARE_PROVIDER_SITE_OTHER): Payer: 59

## 2015-09-27 ENCOUNTER — Encounter: Payer: Self-pay | Admitting: Internal Medicine

## 2015-09-27 ENCOUNTER — Ambulatory Visit (INDEPENDENT_AMBULATORY_CARE_PROVIDER_SITE_OTHER): Payer: 59 | Admitting: Internal Medicine

## 2015-09-27 VITALS — BP 112/72 | HR 68 | Temp 98.2°F | Resp 16 | Ht 65.0 in | Wt 133.0 lb

## 2015-09-27 DIAGNOSIS — K625 Hemorrhage of anus and rectum: Secondary | ICD-10-CM | POA: Diagnosis not present

## 2015-09-27 DIAGNOSIS — Z0000 Encounter for general adult medical examination without abnormal findings: Secondary | ICD-10-CM

## 2015-09-27 DIAGNOSIS — F909 Attention-deficit hyperactivity disorder, unspecified type: Secondary | ICD-10-CM

## 2015-09-27 DIAGNOSIS — R197 Diarrhea, unspecified: Secondary | ICD-10-CM

## 2015-09-27 LAB — CBC
HEMATOCRIT: 40.8 % (ref 36.0–46.0)
HEMOGLOBIN: 13.5 g/dL (ref 12.0–15.0)
MCHC: 33.2 g/dL (ref 30.0–36.0)
MCV: 89.8 fl (ref 78.0–100.0)
PLATELETS: 250 10*3/uL (ref 150.0–400.0)
RBC: 4.54 Mil/uL (ref 3.87–5.11)
RDW: 13.2 % (ref 11.5–15.5)
WBC: 5.9 10*3/uL (ref 4.0–10.5)

## 2015-09-27 LAB — COMPREHENSIVE METABOLIC PANEL
ALBUMIN: 4.3 g/dL (ref 3.5–5.2)
ALK PHOS: 44 U/L (ref 39–117)
ALT: 13 U/L (ref 0–35)
AST: 16 U/L (ref 0–37)
BUN: 15 mg/dL (ref 6–23)
CALCIUM: 9.3 mg/dL (ref 8.4–10.5)
CHLORIDE: 102 meq/L (ref 96–112)
CO2: 29 mEq/L (ref 19–32)
CREATININE: 0.91 mg/dL (ref 0.40–1.20)
GFR: 73.29 mL/min (ref >60.00)
GLUCOSE: 92 mg/dL (ref 70–99)
POTASSIUM: 3.7 meq/L (ref 3.5–5.1)
Sodium: 138 mEq/L (ref 135–145)
TOTAL PROTEIN: 7.1 g/dL (ref 6.0–8.3)
Total Bilirubin: 0.7 mg/dL (ref 0.2–1.2)

## 2015-09-27 LAB — FERRITIN: FERRITIN: 15.2 ng/mL (ref 10.0–291.0)

## 2015-09-27 LAB — VITAMIN B12: Vitamin B-12: 538 pg/mL (ref 211–911)

## 2015-09-27 LAB — FOLATE: Folate: >23.8 ng/mL (ref >5.9)

## 2015-09-27 LAB — VITAMIN D 25 HYDROXY (VIT D DEFICIENCY, FRACTURES): VITD: 38.76 ng/mL (ref 30.00–100.00)

## 2015-09-27 NOTE — Progress Notes (Signed)
   Subjective:    Patient ID: Joanna Thompson, female    DOB: [birth year], 39 y.o.   MRN: [id number]  HPI The patient is a 39 YO female coming in for wellness. No new concerns but same as prior. Please see A/P for treatment and status of chronic problems.   PMH, [facility], social history reviewed and updated.   Review of Systems  Constitutional: Negative for fever, activity change, appetite change, fatigue and unexpected weight change.  HENT: Negative.   Eyes: Negative.   Respiratory: Negative for cough, chest tightness, shortness of breath and wheezing.   Cardiovascular: Negative for chest pain, palpitations and leg swelling.  Gastrointestinal: Positive for abdominal pain, diarrhea and blood in stool. Negative for nausea, vomiting, constipation and abdominal distention.  Musculoskeletal: Negative.   Skin: Negative.   Neurological: Negative.   Psychiatric/Behavioral: Positive for decreased concentration. Negative for suicidal ideas, sleep disturbance and self-injury. The patient is nervous/anxious.       Objective:   Physical Exam  Constitutional: She is oriented to person, place, and time. She appears well-developed and well-nourished.  HENT:  Head: Normocephalic and atraumatic.  Eyes: EOM are normal. Pupils are equal, round, and reactive to light.  Neck: Normal range of motion.  Cardiovascular: Normal rate and regular rhythm.   Pulmonary/Chest: Effort normal and breath sounds normal. No respiratory distress. She has no wheezes. She has no rales.  Abdominal: Soft. Bowel sounds are normal. She exhibits no distension. There is tenderness. There is no rebound and no guarding.  Mild tenderness RUQ  Musculoskeletal: She exhibits no edema.  Neurological: She is alert and oriented to person, place, and time. Coordination normal.  Skin: Skin is warm and dry.  Psychiatric: She has a normal mood and affect.   Filed Vitals:   09/27/15 1103  BP: 112/72  Pulse: 68  Temp: 98.2 F (36.8 C)    TempSrc: Oral  Resp: 16  Height: 5\' 5"  (1.651 m)  Weight: 133 lb (60.328 kg)  SpO2: 98%      Assessment & Plan:

## 2015-09-27 NOTE — Patient Instructions (Signed)
We will check the blood work today including the iron levels and blood counts as well as the test for the gluten allergy (celiac disease).   Health Maintenance, Female Adopting a healthy lifestyle and getting preventive care can go a long way to promote health and wellness. Talk with your health care provider about what schedule of regular examinations is right for you. This is a good chance for you to check in with your provider about disease prevention and staying healthy. In between checkups, there are plenty of things you can do on your own. Experts have done a lot of research about which lifestyle changes and preventive measures are most likely to keep you healthy. Ask your health care provider for more information. WEIGHT AND DIET  Eat a healthy diet  Be sure to include plenty of vegetables, fruits, low-fat dairy products, and lean protein.  Do not eat a lot of foods high in solid fats, added sugars, or salt.  Get regular exercise. This is one of the most important things you can do for your health.  Most adults should exercise for at least 150 minutes each week. The exercise should increase your heart rate and make you sweat (moderate-intensity exercise).  Most adults should also do strengthening exercises at least twice a week. This is in addition to the moderate-intensity exercise.  Maintain a healthy weight  Body mass index (BMI) is a measurement that can be used to identify possible weight problems. It estimates body fat based on height and weight. Your health care provider can help determine your BMI and help you achieve or maintain a healthy weight.  For females 48 years of age and older:   A BMI below 18.5 is considered underweight.  A BMI of 18.5 to 24.9 is normal.  A BMI of 25 to 29.9 is considered overweight.  A BMI of 30 and above is considered obese.  Watch levels of cholesterol and blood lipids  You should start having your blood tested for lipids and  cholesterol at 38 years of age, then have this test every 5 years.  You may need to have your cholesterol levels checked more often if:  Your lipid or cholesterol levels are high.  You are older than 38 years of age.  You are at high risk for heart disease.  CANCER SCREENING   Lung Cancer  Lung cancer screening is recommended for adults 54-72 years old who are at high risk for lung cancer because of a history of smoking.  A yearly low-dose CT scan of the lungs is recommended for people who:  Currently smoke.  Have quit within the past 15 years.  Have at least a 30-pack-year history of smoking. A pack year is smoking an average of one pack of cigarettes a day for 1 year.  Yearly screening should continue until it has been 15 years since you quit.  Yearly screening should stop if you develop a health problem that would prevent you from having lung cancer treatment.  Breast Cancer  Practice breast self-awareness. This means understanding how your breasts normally appear and feel.  It also means doing regular breast self-exams. Let your health care provider know about any changes, no matter how small.  If you are in your 20s or 30s, you should have a clinical breast exam (CBE) by a health care provider every 1-3 years as part of a regular health exam.  If you are 58 or older, have a CBE every year. Also consider having a  breast X-ray (mammogram) every year.  If you have a family history of breast cancer, talk to your health care provider about genetic screening.  If you are at high risk for breast cancer, talk to your health care provider about having an MRI and a mammogram every year.  Breast cancer gene (BRCA) assessment is recommended for women who have family members with BRCA-related cancers. BRCA-related cancers include:  Breast.  Ovarian.  Tubal.  Peritoneal cancers.  Results of the assessment will determine the need for genetic counseling and BRCA1 and BRCA2  testing. Cervical Cancer Your health care provider may recommend that you be screened regularly for cancer of the pelvic organs (ovaries, uterus, and vagina). This screening involves a pelvic examination, including checking for microscopic changes to the surface of your cervix (Pap test). You may be encouraged to have this screening done every 3 years, beginning at age 21.  For women ages 30-65, health care providers may recommend pelvic exams and Pap testing every 3 years, or they may recommend the Pap and pelvic exam, combined with testing for human papilloma virus (HPV), every 5 years. Some types of HPV increase your risk of cervical cancer. Testing for HPV may also be done on women of any age with unclear Pap test results.  Other health care providers may not recommend any screening for nonpregnant women who are considered low risk for pelvic cancer and who do not have symptoms. Ask your health care provider if a screening pelvic exam is right for you.  If you have had past treatment for cervical cancer or a condition that could lead to cancer, you need Pap tests and screening for cancer for at least 20 years after your treatment. If Pap tests have been discontinued, your risk factors (such as having a new sexual partner) need to be reassessed to determine if screening should resume. Some women have medical problems that increase the chance of getting cervical cancer. In these cases, your health care provider may recommend more frequent screening and Pap tests. Colorectal Cancer  This type of cancer can be detected and often prevented.  Routine colorectal cancer screening usually begins at 38 years of age and continues through 38 years of age.  Your health care provider may recommend screening at an earlier age if you have risk factors for colon cancer.  Your health care provider may also recommend using home test kits to check for hidden blood in the stool.  A small camera at the end of a  tube can be used to examine your colon directly (sigmoidoscopy or colonoscopy). This is done to check for the earliest forms of colorectal cancer.  Routine screening usually begins at age 50.  Direct examination of the colon should be repeated every 5-10 years through 38 years of age. However, you may need to be screened more often if early forms of precancerous polyps or small growths are found. Skin Cancer  Check your skin from head to toe regularly.  Tell your health care provider about any new moles or changes in moles, especially if there is a change in a mole's shape or color.  Also tell your health care provider if you have a mole that is larger than the size of a pencil eraser.  Always use sunscreen. Apply sunscreen liberally and repeatedly throughout the day.  Protect yourself by wearing long sleeves, pants, a wide-brimmed hat, and sunglasses whenever you are outside. HEART DISEASE, DIABETES, AND HIGH BLOOD PRESSURE   High blood pressure causes   heart disease and increases the risk of stroke. High blood pressure is more likely to develop in:  People who have blood pressure in the high end of the normal range (130-139/85-89 mm Hg).  People who are overweight or obese.  People who are African American.  If you are 18-39 years of age, have your blood pressure checked every 3-5 years. If you are 40 years of age or older, have your blood pressure checked every year. You should have your blood pressure measured twice--once when you are at a hospital or clinic, and once when you are not at a hospital or clinic. Record the average of the two measurements. To check your blood pressure when you are not at a hospital or clinic, you can use:  An automated blood pressure machine at a pharmacy.  A home blood pressure monitor.  If you are between 55 years and 79 years old, ask your health care provider if you should take aspirin to prevent strokes.  Have regular diabetes screenings. This  involves taking a blood sample to check your fasting blood sugar level.  If you are at a normal weight and have a low risk for diabetes, have this test once every three years after 38 years of age.  If you are overweight and have a high risk for diabetes, consider being tested at a younger age or more often. PREVENTING INFECTION  Hepatitis B  If you have a higher risk for hepatitis B, you should be screened for this virus. You are considered at high risk for hepatitis B if:  You were born in a country where hepatitis B is common. Ask your health care provider which countries are considered high risk.  Your parents were born in a high-risk country, and you have not been immunized against hepatitis B (hepatitis B vaccine).  You have HIV or AIDS.  You use needles to inject street drugs.  You live with someone who has hepatitis B.  You have had sex with someone who has hepatitis B.  You get hemodialysis treatment.  You take certain medicines for conditions, including cancer, organ transplantation, and autoimmune conditions. Hepatitis C  Blood testing is recommended for:  Everyone born from 1945 through 1965.  Anyone with known risk factors for hepatitis C. Sexually transmitted infections (STIs)  You should be screened for sexually transmitted infections (STIs) including gonorrhea and chlamydia if:  You are sexually active and are younger than 38 years of age.  You are older than 38 years of age and your health care provider tells you that you are at risk for this type of infection.  Your sexual activity has changed since you were last screened and you are at an increased risk for chlamydia or gonorrhea. Ask your health care provider if you are at risk.  If you do not have HIV, but are at risk, it may be recommended that you take a prescription medicine daily to prevent HIV infection. This is called pre-exposure prophylaxis (PrEP). You are considered at risk if:  You are  sexually active and do not regularly use condoms or know the HIV status of your partner(s).  You take drugs by injection.  You are sexually active with a partner who has HIV. Talk with your health care provider about whether you are at high risk of being infected with HIV. If you choose to begin PrEP, you should first be tested for HIV. You should then be tested every 3 months for as long as you are   taking PrEP.  PREGNANCY   If you are premenopausal and you may become pregnant, ask your health care provider about preconception counseling.  If you may become pregnant, take 400 to 800 micrograms (mcg) of folic acid every day.  If you want to prevent pregnancy, talk to your health care provider about birth control (contraception). OSTEOPOROSIS AND MENOPAUSE   Osteoporosis is a disease in which the bones lose minerals and strength with aging. This can result in serious bone fractures. Your risk for osteoporosis can be identified using a bone density scan.  If you are 65 years of age or older, or if you are at risk for osteoporosis and fractures, ask your health care provider if you should be screened.  Ask your health care provider whether you should take a calcium or vitamin D supplement to lower your risk for osteoporosis.  Menopause may have certain physical symptoms and risks.  Hormone replacement therapy may reduce some of these symptoms and risks. Talk to your health care provider about whether hormone replacement therapy is right for you.  HOME CARE INSTRUCTIONS   Schedule regular health, dental, and eye exams.  Stay current with your immunizations.   Do not use any tobacco products including cigarettes, chewing tobacco, or electronic cigarettes.  If you are pregnant, do not drink alcohol.  If you are breastfeeding, limit how much and how often you drink alcohol.  Limit alcohol intake to no more than 1 drink per day for nonpregnant women. One drink equals 12 ounces of beer, 5  ounces of wine, or 1 ounces of hard liquor.  Do not use street drugs.  Do not share needles.  Ask your health care provider for help if you need support or information about quitting drugs.  Tell your health care provider if you often feel depressed.  Tell your health care provider if you have ever been abused or do not feel safe at home.   This information is not intended to replace advice given to you by your health care provider. Make sure you discuss any questions you have with your health care provider.   Document Released: 05/25/2011 Document Revised: 11/30/2014 Document Reviewed: 10/11/2013 Elsevier Interactive Patient Education 2016 [employer]  

## 2015-09-27 NOTE — Assessment & Plan Note (Signed)
Still having problems with this and has not returned to GI after the colonoscopy which was unrevealing. Checking for gluten allergy today as well as CBC and iron levels. She started taking iron pills and got constipation so does not wish to take. Does take multivitamin daily.

## 2015-09-27 NOTE — Progress Notes (Signed)
Pre visit review using our clinic review tool, if applicable. No additional management support is needed unless otherwise documented below in the visit note. 

## 2015-09-27 NOTE — Assessment & Plan Note (Signed)
Tdap, pap and flu up to date. Cholesterol good last year and will not check this year. Checking labs, non-smoker and exercises regularly.

## 2015-09-27 NOTE — Assessment & Plan Note (Signed)
Sees psych for med adjustment and just saw them last week. Getting back on her adderall.

## 2015-09-30 LAB — GLIADIN ANTIBODIES, SERUM
Gliadin IgA: 4 Units (ref <20)
Gliadin IgG: 2 Units (ref <20)

## 2015-09-30 LAB — TISSUE TRANSGLUTAMINASE, IGA: Tissue Transglutaminase Ab, IgA: 1 U/mL (ref <4)

## 2015-10-01 LAB — RETICULIN ANTIBODIES, IGA W TITER: Reticulin Ab, IgA: NEGATIVE

## 2015-12-16 ENCOUNTER — Encounter: Payer: Self-pay | Admitting: Internal Medicine

## 2015-12-27 ENCOUNTER — Encounter: Payer: Self-pay | Admitting: Internal Medicine

## 2015-12-27 ENCOUNTER — Ambulatory Visit (INDEPENDENT_AMBULATORY_CARE_PROVIDER_SITE_OTHER): Payer: 59 | Admitting: Internal Medicine

## 2015-12-27 VITALS — BP 108/70 | HR 77 | Temp 98.3°F | Resp 18 | Ht 65.0 in | Wt 129.0 lb

## 2015-12-27 DIAGNOSIS — R1011 Right upper quadrant pain: Secondary | ICD-10-CM | POA: Diagnosis not present

## 2015-12-27 NOTE — Progress Notes (Signed)
Pre visit review using our clinic review tool, if applicable. No additional management support is needed unless otherwise documented below in the visit note. 

## 2015-12-29 DIAGNOSIS — R1011 Right upper quadrant pain: Secondary | ICD-10-CM | POA: Insufficient documentation

## 2015-12-29 NOTE — Progress Notes (Signed)
   Subjective:    Patient ID: Joanna Thompson, female    DOB: Apr 29, 1976, 40 y.o.   MRN: [id number]  HPI The patient is a 40 YO female coming in for RUQ and some epigastric pain. She had a flare which lasted 2-3 days. Severe pain which was worse with eating. Now is subsided. Did not try anything for the pain. Has not had medication change. Strong family history of gallstones in her family and she wants to be checked. Did also have nausea but no vomiting. No change to her bowels. She is still having intermittent blood in her stools.   Review of Systems  Constitutional: Negative for fever, activity change, appetite change, fatigue and unexpected weight change.  HENT: Negative.   Eyes: Negative.   Respiratory: Negative for cough, chest tightness, shortness of breath and wheezing.   Cardiovascular: Negative for chest pain, palpitations and leg swelling.  Gastrointestinal: Positive for nausea, abdominal pain and blood in stool. Negative for vomiting, constipation and abdominal distention.  Musculoskeletal: Negative.   Skin: Negative.   Neurological: Negative.       Objective:   Physical Exam  Constitutional: She is oriented to person, place, and time. She appears well-developed and well-nourished.  HENT:  Head: Normocephalic and atraumatic.  Eyes: EOM are normal. Pupils are equal, round, and reactive to light.  Neck: Normal range of motion.  Cardiovascular: Normal rate and regular rhythm.   Pulmonary/Chest: Effort normal and breath sounds normal. No respiratory distress. She has no wheezes. She has no rales.  Abdominal: Soft. Bowel sounds are normal. She exhibits no distension. There is tenderness. There is no rebound and no guarding.  Mild tenderness RUQ  Musculoskeletal: She exhibits no edema.  Neurological: She is alert and oriented to person, place, and time. Coordination normal.  Skin: Skin is warm and dry.  Psychiatric: She has a normal mood and affect.   Filed Vitals:   12/27/15  1511  BP: 108/70  Pulse: 77  Temp: 98.3 F (36.8 C)  TempSrc: Oral  Resp: 18  Height: 5\' 5"  (1.651 m)  Weight: 129 lb (58.514 kg)  SpO2: 98%      Assessment & Plan:

## 2015-12-29 NOTE — Assessment & Plan Note (Signed)
Although she has had RUQ pain in the past this was different. Will get US abdomen for possible gallstones. Since the pain is gone today will not repeat labs since normal at last visit. If she gets recurrent symptoms we will get labs during symptoms.

## 2016-01-02 ENCOUNTER — Ambulatory Visit
Admission: RE | Admit: 2016-01-02 | Discharge: 2016-01-02 | Disposition: A | Discharge status: Home / Self Care | Payer: 59 | Source: Ambulatory Visit | Attending: Internal Medicine | Admitting: Internal Medicine

## 2016-01-02 DIAGNOSIS — R1011 Right upper quadrant pain: Secondary | ICD-10-CM

## 2017-01-15 ENCOUNTER — Other Ambulatory Visit (INDEPENDENT_AMBULATORY_CARE_PROVIDER_SITE_OTHER): Payer: 59

## 2017-01-15 ENCOUNTER — Ambulatory Visit (INDEPENDENT_AMBULATORY_CARE_PROVIDER_SITE_OTHER): Payer: 59 | Admitting: Internal Medicine

## 2017-01-15 ENCOUNTER — Encounter: Payer: Self-pay | Admitting: Internal Medicine

## 2017-01-15 VITALS — BP 100/64 | HR 66 | Temp 98.0°F | Ht 65.0 in | Wt 130.0 lb

## 2017-01-15 DIAGNOSIS — G43709 Chronic migraine without aura, not intractable, without status migrainosus: Secondary | ICD-10-CM | POA: Diagnosis not present

## 2017-01-15 DIAGNOSIS — Z0000 Encounter for general adult medical examination without abnormal findings: Secondary | ICD-10-CM | POA: Diagnosis not present

## 2017-01-15 LAB — COMPREHENSIVE METABOLIC PANEL
ALBUMIN: 4.1 g/dL (ref 3.5–5.2)
ALT: 10 U/L (ref 0–35)
AST: 16 U/L (ref 0–37)
Alkaline Phosphatase: 37 U/L — ABNORMAL LOW (ref 39–117)
BUN: 15 mg/dL (ref 6–23)
CHLORIDE: 103 meq/L (ref 96–112)
CO2: 28 meq/L (ref 19–32)
CREATININE: 0.89 mg/dL (ref 0.40–1.20)
Calcium: 9.2 mg/dL (ref 8.4–10.5)
GFR: 74.69 mL/min (ref >60.00)
GLUCOSE: 89 mg/dL (ref 70–99)
Potassium: 3.9 mEq/L (ref 3.5–5.1)
SODIUM: 137 meq/L (ref 135–145)
Total Bilirubin: 0.6 mg/dL (ref 0.2–1.2)
Total Protein: 6.5 g/dL (ref 6.0–8.3)

## 2017-01-15 LAB — CBC
HCT: 40.5 % (ref 36.0–46.0)
Hemoglobin: 13.4 g/dL (ref 12.0–15.0)
MCHC: 33 g/dL (ref 30.0–36.0)
MCV: 89.2 fl (ref 78.0–100.0)
Platelets: 229 10*3/uL (ref 150.0–400.0)
RBC: 4.54 Mil/uL (ref 3.87–5.11)
RDW: 14.4 % (ref 11.5–15.5)
WBC: 5.3 10*3/uL (ref 4.0–10.5)

## 2017-01-15 LAB — LIPID PANEL
Cholesterol: 186 mg/dL (ref 0–200)
HDL: 74.3 mg/dL (ref >39.00)
LDL CALC: 101 mg/dL — AB (ref 0–99)
NONHDL: 111.77
Total CHOL/HDL Ratio: 3
Triglycerides: 52 mg/dL (ref 0.0–149.0)
VLDL: 10.4 mg/dL (ref 0.0–40.0)

## 2017-01-15 LAB — TSH: TSH: 0.6 u[IU]/mL (ref 0.35–4.50)

## 2017-01-15 LAB — T4, FREE: FREE T4: 1.22 ng/dL (ref 0.60–1.60)

## 2017-01-15 MED ORDER — ESZOPICLONE 3 MG PO TABS: 3.0000 mg | ORAL_TABLET | Freq: Every day | ORAL | 5 refills | Status: DC

## 2017-01-15 NOTE — Patient Instructions (Signed)
We are checking the labs today and will send you the results.   Health Maintenance, Female Introduction Adopting a healthy lifestyle and getting preventive care can go a long way to promote health and wellness. Talk with your health care provider about what schedule of regular examinations is right for you. This is a good chance for you to check in with your provider about disease prevention and staying healthy. In between checkups, there are plenty of things you can do on your own. Experts have done a lot of research about which lifestyle changes and preventive measures are most likely to keep you healthy. Ask your health care provider for more information. Weight and diet Eat a healthy diet  Be sure to include plenty of vegetables, fruits, low-fat dairy products, and lean protein.  Do not eat a lot of foods high in solid fats, added sugars, or salt.  Get regular exercise. This is one of the most important things you can do for your health.  Most adults should exercise for at least 150 minutes each week. The exercise should increase your heart rate and make you sweat (moderate-intensity exercise).  Most adults should also do strengthening exercises at least twice a week. This is in addition to the moderate-intensity exercise. Maintain a healthy weight  Body mass index (BMI) is a measurement that can be used to identify possible weight problems. It estimates body fat based on height and weight. Your health care provider can help determine your BMI and help you achieve or maintain a healthy weight.  For females 27 years of age and older:  A BMI below 18.5 is considered underweight.  A BMI of 18.5 to 24.9 is normal.  A BMI of 25 to 29.9 is considered overweight.  A BMI of 30 and above is considered obese. Watch levels of cholesterol and blood lipids  You should start having your blood tested for lipids and cholesterol at 40 years of age, then have this test every 5 years.  You may  need to have your cholesterol levels checked more often if:  Your lipid or cholesterol levels are high.  You are older than 40 years of age.  You are at high risk for heart disease. Cancer screening Lung Cancer  Lung cancer screening is recommended for adults 76-87 years old who are at high risk for lung cancer because of a history of smoking.  A yearly low-dose CT scan of the lungs is recommended for people who:  Currently smoke.  Have quit within the past 15 years.  Have at least a 30-pack-year history of smoking. A pack year is smoking an average of one pack of cigarettes a day for 1 year.  Yearly screening should continue until it has been 15 years since you quit.  Yearly screening should stop if you develop a health problem that would prevent you from having lung cancer treatment. Breast Cancer  Practice breast self-awareness. This means understanding how your breasts normally appear and feel.  It also means doing regular breast self-exams. Let your health care provider know about any changes, no matter how small.  If you are in your 20s or 30s, you should have a clinical breast exam (CBE) by a health care provider every 1-3 years as part of a regular health exam.  If you are 46 or older, have a CBE every year. Also consider having a breast X-ray (mammogram) every year.  If you have a family history of breast cancer, talk to your health care  provider about genetic screening.  If you are at high risk for breast cancer, talk to your health care provider about having an MRI and a mammogram every year.  Breast cancer gene (BRCA) assessment is recommended for women who have family members with BRCA-related cancers. BRCA-related cancers include:  Breast.  Ovarian.  Tubal.  Peritoneal cancers.  Results of the assessment will determine the need for genetic counseling and BRCA1 and BRCA2 testing. Cervical Cancer  Your health care provider may recommend that you be  screened regularly for cancer of the pelvic organs (ovaries, uterus, and vagina). This screening involves a pelvic examination, including checking for microscopic changes to the surface of your cervix (Pap test). You may be encouraged to have this screening done every 3 years, beginning at age 37.  For women ages 68-65, health care providers may recommend pelvic exams and Pap testing every 3 years, or they may recommend the Pap and pelvic exam, combined with testing for human papilloma virus (HPV), every 5 years. Some types of HPV increase your risk of cervical cancer. Testing for HPV may also be done on women of any age with unclear Pap test results.  Other health care providers may not recommend any screening for nonpregnant women who are considered low risk for pelvic cancer and who do not have symptoms. Ask your health care provider if a screening pelvic exam is right for you.  If you have had past treatment for cervical cancer or a condition that could lead to cancer, you need Pap tests and screening for cancer for at least 20 years after your treatment. If Pap tests have been discontinued, your risk factors (such as having a new sexual partner) need to be reassessed to determine if screening should resume. Some women have medical problems that increase the chance of getting cervical cancer. In these cases, your health care provider may recommend more frequent screening and Pap tests. Colorectal Cancer  This type of cancer can be detected and often prevented.  Routine colorectal cancer screening usually begins at 40 years of age and continues through 40 years of age.  Your health care provider may recommend screening at an earlier age if you have risk factors for colon cancer.  Your health care provider may also recommend using home test kits to check for hidden blood in the stool.  A small camera at the end of a tube can be used to examine your colon directly (sigmoidoscopy or colonoscopy).  This is done to check for the earliest forms of colorectal cancer.  Routine screening usually begins at age 32.  Direct examination of the colon should be repeated every 5-10 years through 40 years of age. However, you may need to be screened more often if early forms of precancerous polyps or small growths are found. Skin Cancer  Check your skin from head to toe regularly.  Tell your health care provider about any new moles or changes in moles, especially if there is a change in a mole's shape or color.  Also tell your health care provider if you have a mole that is larger than the size of a pencil eraser.  Always use sunscreen. Apply sunscreen liberally and repeatedly throughout the day.  Protect yourself by wearing long sleeves, pants, a wide-brimmed hat, and sunglasses whenever you are outside. Heart disease, diabetes, and high blood pressure  High blood pressure causes heart disease and increases the risk of stroke. High blood pressure is more likely to develop in:  People who  have blood pressure in the high end of the normal range (130-139/85-89 mm Hg).  People who are overweight or obese.  People who are African American.  If you are 70-58 years of age, have your blood pressure checked every 3-5 years. If you are 37 years of age or older, have your blood pressure checked every year. You should have your blood pressure measured twice-once when you are at a hospital or clinic, and once when you are not at a hospital or clinic. Record the average of the two measurements. To check your blood pressure when you are not at a hospital or clinic, you can use:  An automated blood pressure machine at a pharmacy.  A home blood pressure monitor.  If you are between 22 years and 62 years old, ask your health care provider if you should take aspirin to prevent strokes.  Have regular diabetes screenings. This involves taking a blood sample to check your fasting blood sugar level.  If you  are at a normal weight and have a low risk for diabetes, have this test once every three years after 40 years of age.  If you are overweight and have a high risk for diabetes, consider being tested at a younger age or more often. Preventing infection Hepatitis B  If you have a higher risk for hepatitis B, you should be screened for this virus. You are considered at high risk for hepatitis B if:  You were born in a country where hepatitis B is common. Ask your health care provider which countries are considered high risk.  Your parents were born in a high-risk country, and you have not been immunized against hepatitis B (hepatitis B vaccine).  You have HIV or AIDS.  You use needles to inject street drugs.  You live with someone who has hepatitis B.  You have had sex with someone who has hepatitis B.  You get hemodialysis treatment.  You take certain medicines for conditions, including cancer, organ transplantation, and autoimmune conditions. Hepatitis C  Blood testing is recommended for:  Everyone born from [birth year] through 1964.  Anyone with known risk factors for hepatitis C. Sexually transmitted infections (STIs)  You should be screened for sexually transmitted infections (STIs) including gonorrhea and chlamydia if:  You are sexually active and are younger than 40 years of age.  You are older than 40 years of age and your health care provider tells you that you are at risk for this type of infection.  Your sexual activity has changed since you were last screened and you are at an increased risk for chlamydia or gonorrhea. Ask your health care provider if you are at risk.  If you do not have HIV, but are at risk, it may be recommended that you take a prescription medicine daily to prevent HIV infection. This is called pre-exposure prophylaxis (PrEP). You are considered at risk if:  You are sexually active and do not regularly use condoms or know the HIV status of your  partner(s).  You take drugs by injection.  You are sexually active with a partner who has HIV. Talk with your health care provider about whether you are at high risk of being infected with HIV. If you choose to begin PrEP, you should first be tested for HIV. You should then be tested every 3 months for as long as you are taking PrEP. Pregnancy  If you are premenopausal and you may become pregnant, ask your health care provider about preconception counseling.  If you may become pregnant, take 400 to 800 micrograms (mcg) of folic acid every day.  If you want to prevent pregnancy, talk to your health care provider about birth control (contraception). Osteoporosis and menopause  Osteoporosis is a disease in which the bones lose minerals and strength with aging. This can result in serious bone fractures. Your risk for osteoporosis can be identified using a bone density scan.  If you are 8 years of age or older, or if you are at risk for osteoporosis and fractures, ask your health care provider if you should be screened.  Ask your health care provider whether you should take a calcium or vitamin D supplement to lower your risk for osteoporosis.  Menopause may have certain physical symptoms and risks.  Hormone replacement therapy may reduce some of these symptoms and risks. Talk to your health care provider about whether hormone replacement therapy is right for you. Follow these instructions at home:  Schedule regular health, dental, and eye exams.  Stay current with your immunizations.  Do not use any tobacco products including cigarettes, chewing tobacco, or electronic cigarettes.  If you are pregnant, do not drink alcohol.  If you are breastfeeding, limit how much and how often you drink alcohol.  Limit alcohol intake to no more than 1 drink per day for nonpregnant women. One drink equals 12 ounces of beer, 5 ounces of wine, or 1 ounces of hard liquor.  Do not use street  drugs.  Do not share needles.  Ask your health care provider for help if you need support or information about quitting drugs.  Tell your health care provider if you often feel depressed.  Tell your health care provider if you have ever been abused or do not feel safe at home. This information is not intended to replace advice given to you by your health care provider. Make sure you discuss any questions you have with your health care provider. Document Released: 05/25/2011 Document Revised: 04/16/2016 Document Reviewed: 08/13/2015  2017 Elsevier

## 2017-01-15 NOTE — Progress Notes (Signed)
Pre visit review using our clinic review tool, if applicable. No additional management support is needed unless otherwise documented below in the visit note. 

## 2017-01-15 NOTE — Progress Notes (Signed)
   Subjective:    Patient ID: Joanna Thompson, female    DOB: May 19, 1976, 41 y.o.   MRN: [id number]  HPI The patient is a 41 YO female coming in for wellness. No new concerns.   PMH, [facility], social history reviewed and updated.   Review of Systems  Constitutional: Negative.   HENT: Negative.   Eyes: Negative.   Respiratory: Negative for cough, chest tightness and shortness of breath.   Cardiovascular: Negative for chest pain, palpitations and leg swelling.  Gastrointestinal: Negative for abdominal distention, abdominal pain, constipation, diarrhea, nausea and vomiting.       Some sensation of needing to empty bowels all the time  Musculoskeletal: Negative.   Skin: Negative.   Neurological: Negative.   Psychiatric/Behavioral: Negative.       Objective:   Physical Exam  Constitutional: She is oriented to person, place, and time. She appears well-developed and well-nourished.  HENT:  Head: Normocephalic and atraumatic.  Eyes: EOM are normal.  Neck: Normal range of motion.  Cardiovascular: Normal rate and regular rhythm.   Pulmonary/Chest: Effort normal and breath sounds normal. No respiratory distress. She has no wheezes. She has no rales.  Abdominal: Soft. Bowel sounds are normal. She exhibits no distension. There is no tenderness. There is no rebound.  Musculoskeletal: She exhibits no edema.  Neurological: She is alert and oriented to person, place, and time. Coordination normal.  Skin: Skin is warm and dry.  Psychiatric: She has a normal mood and affect.   Vitals:   01/15/17 0854  BP: 100/64  Pulse: 66  Temp: 98 F (36.7 C)  TempSrc: Oral  SpO2: 100%  Weight: 130 lb (59 kg)  Height: 5\' 5"  (1.651 m)      Assessment & Plan:

## 2017-01-15 NOTE — Assessment & Plan Note (Signed)
Checking labs, pap smear with gyn. Flu shot allergy. Tetanus up to date. Counseled on sun safety and mole surveillance. Given screening recommendations. Counseled on dangers of distracted driving.

## 2017-01-15 NOTE — Assessment & Plan Note (Signed)
Handles with OTC meds and sensitive to rx meds for migraines.

## 2017-01-18 LAB — FOOD ALLERGY PANEL
Clams: <0.1 kU/L
Corn: <0.1 kU/L
Egg White IgE: <0.1 kU/L
Shrimp IgE: <0.1 kU/L
Walnut: <0.1 kU/L

## 2017-01-29 ENCOUNTER — Other Ambulatory Visit: Payer: Self-pay | Admitting: Internal Medicine

## 2017-01-29 DIAGNOSIS — E041 Nontoxic single thyroid nodule: Secondary | ICD-10-CM

## 2017-01-29 DIAGNOSIS — Z85850 Personal history of malignant neoplasm of thyroid: Secondary | ICD-10-CM

## 2017-01-29 DIAGNOSIS — E063 Autoimmune thyroiditis: Secondary | ICD-10-CM

## 2017-03-31 ENCOUNTER — Ambulatory Visit
Admission: RE | Admit: 2017-03-31 | Discharge: 2017-03-31 | Disposition: A | Discharge status: Home / Self Care | Payer: 59 | Source: Ambulatory Visit | Attending: Internal Medicine | Admitting: Internal Medicine

## 2017-03-31 DIAGNOSIS — E063 Autoimmune thyroiditis: Secondary | ICD-10-CM

## 2017-03-31 DIAGNOSIS — Z85850 Personal history of malignant neoplasm of thyroid: Secondary | ICD-10-CM

## 2017-03-31 DIAGNOSIS — E041 Nontoxic single thyroid nodule: Secondary | ICD-10-CM

## 2017-05-21 ENCOUNTER — Telehealth: Payer: Self-pay | Admitting: Internal Medicine

## 2017-05-21 NOTE — Telephone Encounter (Signed)
We dont have samples of this medication

## 2017-05-21 NOTE — Telephone Encounter (Signed)
Pt would like some more samples of Lialdi. Please advise

## 2017-05-21 NOTE — Telephone Encounter (Signed)
I called pt back and informed, and she asked me to transfer her to gastro to get samples and I did

## 2017-07-13 ENCOUNTER — Ambulatory Visit (INDEPENDENT_AMBULATORY_CARE_PROVIDER_SITE_OTHER): Payer: 59 | Admitting: Gastroenterology

## 2017-07-13 ENCOUNTER — Encounter: Payer: Self-pay | Admitting: Gastroenterology

## 2017-07-13 VITALS — BP 120/70 | HR 96 | Ht 65.0 in | Wt 132.2 lb

## 2017-07-13 DIAGNOSIS — R194 Change in bowel habit: Secondary | ICD-10-CM | POA: Diagnosis not present

## 2017-07-13 DIAGNOSIS — K6289 Other specified diseases of anus and rectum: Secondary | ICD-10-CM

## 2017-07-13 DIAGNOSIS — K649 Unspecified hemorrhoids: Secondary | ICD-10-CM | POA: Diagnosis not present

## 2017-07-13 MED ORDER — AMBULATORY NON FORMULARY MEDICATION: 2 refills | Status: DC

## 2017-07-13 NOTE — Patient Instructions (Signed)
If you are age 40 or older, your body mass index should be between 23-30. Your Body mass index is 22 kg/m. If this is out of the aforementioned range listed, please consider follow up with your Primary Care Provider.  If you are age 54 or younger, your body mass index should be between 19-25. Your Body mass index is 22 kg/m. If this is out of the aformentioned range listed, please consider follow up with your Primary Care Provider.   We have sent the following medications to your pharmacy for you to pick up at your convenience:  Nitroglycerin Cane Beds  Please purchase over the counter Hydrocortisone cream and Glycerine Suppository. Use these nightly.  Also, purchase an over the counter fiber supplement such as Citrucel and take daily.  You have been referred to physical therapy for pelvic floor dysfunction. They will contact you with an appointment date and time. If you do not have an appointment in 1 week please call our office to let us know.  Thank you.

## 2017-07-13 NOTE — Progress Notes (Signed)
HPI :  41 y/o female with history of ADHD, bulimia, here for a visit to establish GI care, new patient with me. She has previously been seen by Dr. Olevia Perches in October 2015. She presented initially with blood in her stools and urgency / inability to completely evacuate, had a colonoscopy at the time which showed hemorrhoids. Prior celiac labs negative  She reports new symptoms since January - she was having rectal pain and blood in her stools. She had a normal Hgb at the time in the Spring. Over time her smyptoms have improved in time. She has a lot of spasm in her anal canal with each bowel movement. She tried some OTC anti-spasmotic when in [place] whih did not help. She has tried some fiber supplementaiton / metamucil which did not help. She has been trying to treat with dietary changes. She has tried Miralax in the past which did not help. She has tried dulcolax and senna. She has tried Anusol in the past only once.   She has variable stool frequency - anywhere from once to 15 times per day - she reports significant straining with each bowel movement with pain to relieve herself. She goes frequently due to urge and inability to evacuate herself. She has pain with straining. She is on the toilet about 15 minutes at a time. She doesn't feel like she can completely empty herself. Stools are soft, no diarrhea or hard stools. She has not had bleeding recently, last seen 2 weeks ago. Over the past few months she was seeing bright red blood in her stools. One child, vaginal delivery.   She has some occasional LLQ pains which comes and goes. She is not sure if having a BM will relieve her symptoms.   Grandmother had colon cancer. No other FH of colon cancer. Father with colon polyps.   She had an [place] in 12/2015 showing small gallbladder polyp that was stable compared to an [place] in 2015  Colonoscopy done 09/05/2014 - 3 rectal polyps - benign lymphoid aggregates or prolapse polyps, hemorrhoids, otherwise normal  exam    Past Medical History:  Diagnosis Date  . ADHD (attention deficit hyperactivity disorder)   . Anemia, iron deficiency   . Generalized headaches   . GERD (gastroesophageal reflux disease)   . H/O bulimia nervosa   . H/O bulimia nervosa   . History of chicken pox 1984  . Hypothyroidism   . Melanoma in situ [facility] 2008   anterior abd - follows with derm annually  . Migraine   . Missed abortion      Past Surgical History:  Procedure Laterality Date  . PLACEMENT OF BREAST IMPLANTS    . RHINOPLASTY    . TONSILLECTOMY AND ADENOIDECTOMY     Family History  Problem Relation Age of Onset  . Colon cancer Maternal Grandmother 90  . Thyroid cancer Father   . Colon polyps Paternal Grandmother    Social History  Substance Use Topics  . Smoking status: Never Smoker  . Smokeless tobacco: Never Used  . Alcohol use Yes   Current Outpatient Prescriptions  Medication Sig Dispense Refill  . ADDERALL XR 10 MG 24 hr capsule Take 10 mg by mouth 2 (two) times daily.     . Eszopiclone 3 MG TABS Take 1 tablet (3 mg total) by mouth at bedtime. 30 tablet 5  . FLUoxetine (PROZAC) 40 MG capsule Take 40 mg by mouth daily.    Marland Kitchen levonorgestrel (MIRENA) 20 MCG/24HR IUD 1 each  by Intrauterine route once.    . Multiple Vitamin (MULTIVITAMIN) tablet Take 1 tablet by mouth daily.    Marland Kitchen SYNTHROID 125 MCG tablet Take 125 mcg by mouth at bedtime. 1/2 tablet once a week     No current facility-administered medications for this visit.    Allergies  Allergen Reactions  . Analgesia [Trolamine] Other (See Comments)    Gets very nauseated with anesthesia  . Codeine Nausea And Vomiting  . Influenza Vaccines Hives and Swelling  . Betadine [Povidone Iodine] Rash     Review of Systems: All systems reviewed and negative except where noted in HPI.   Lab Results  Component Value Date   WBC 5.3 01/15/2017   HGB 13.4 01/15/2017   HCT 40.5 01/15/2017   MCV 89.2 01/15/2017   PLT 229.0 01/15/2017     Lab Results  Component Value Date   CREATININE 0.89 01/15/2017   BUN 15 01/15/2017   NA 137 01/15/2017   K 3.9 01/15/2017   CL 103 01/15/2017   CO2 28 01/15/2017    Lab Results  Component Value Date   ALT 10 01/15/2017   AST 16 01/15/2017   ALKPHOS 37 (L) 01/15/2017   BILITOT 0.6 01/15/2017     Physical Exam: BP 120/70   Pulse 96   Ht 5\' 5"  (1.651 m)   Wt 132 lb 3.2 oz (60 kg)   SpO2 99%   BMI 22.00 kg/m  Constitutional: Pleasant,well-developed, female in no acute distress. HEENT: Normocephalic and atraumatic. Conjunctivae are normal. No scleral icterus. Neck supple.  Cardiovascular: Normal rate, regular rhythm.  Pulmonary/chest: Effort normal and breath sounds normal. No wheezing, rales or rhonchi. Abdominal: Soft, nondistended, nontender.  There are no masses palpable. No hepatomegaly. DRE / Anoscopy - no fissure, internal hemorrhoids with some inflammation, no mass lesion, mildly low squeeze, ? decent of pelvic floor vs normal Extremities: no edema Lymphadenopathy: No cervical adenopathy noted. Neurological: Alert and oriented to person place and time. Skin: Skin is warm and dry. No rashes noted. Psychiatric: Normal mood and affect. Behavior is normal.   ASSESSMENT AND PLAN: 41 year old female presenting with significant straining of her bowels associated with rectal pain and occasional bleeding. She's had a prior colonoscopy for this issue a few years ago which showed internal hemorrhoids and otherwise normal. On DRE and anoscopy today there is no evidence of a fissure, but inflamed hemorrhoids.   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 Her DRE had a mildly low squeeze pressure and potential irregular decent of the pelvic floor, I offered her referral to pelvic floor PT with biofeedback.   In the interim, her pain could be due to spasm from straining or  hemorrhoids. Will treat empirically with topical 0.125% nitroglycerin ointment, and offered her hydrocortizone 1% cream applied with glycerin suppository. Recommend fiber supplement daily such as  Citrucel as well, and minimize straining and time spent on the toilet. We can consider banding of the hemorrhoids if bleeding persists, but will hold off for now given I suspect she may not tolerate this too well due to spasm, will await her course with medical therapy. She agreed.  Linton Cellar, MD [facility] Pager [phone]  CC: Hoyt Koch, *

## 2017-07-19 ENCOUNTER — Ambulatory Visit (INDEPENDENT_AMBULATORY_CARE_PROVIDER_SITE_OTHER): Payer: 59 | Admitting: Nurse Practitioner

## 2017-07-19 ENCOUNTER — Ambulatory Visit (INDEPENDENT_AMBULATORY_CARE_PROVIDER_SITE_OTHER)
Admission: RE | Admit: 2017-07-19 | Discharge: 2017-07-19 | Disposition: A | Discharge status: Home / Self Care | Payer: 59 | Source: Ambulatory Visit | Attending: Nurse Practitioner | Admitting: Nurse Practitioner

## 2017-07-19 ENCOUNTER — Encounter: Payer: Self-pay | Admitting: Nurse Practitioner

## 2017-07-19 VITALS — BP 118/86 | HR 75 | Temp 98.6°F | Ht 65.0 in | Wt 133.0 lb

## 2017-07-19 DIAGNOSIS — B9789 Other viral agents as the cause of diseases classified elsewhere: Secondary | ICD-10-CM

## 2017-07-19 DIAGNOSIS — R5381 Other malaise: Secondary | ICD-10-CM | POA: Diagnosis not present

## 2017-07-19 DIAGNOSIS — R5383 Other fatigue: Secondary | ICD-10-CM | POA: Diagnosis not present

## 2017-07-19 DIAGNOSIS — J069 Acute upper respiratory infection, unspecified: Secondary | ICD-10-CM | POA: Diagnosis not present

## 2017-07-19 DIAGNOSIS — R05 Cough: Secondary | ICD-10-CM

## 2017-07-19 DIAGNOSIS — R059 Cough, unspecified: Secondary | ICD-10-CM

## 2017-07-19 MED ORDER — BENZONATATE 100 MG PO CAPS: 100.0000 mg | ORAL_CAPSULE | Freq: Three times a day (TID) | ORAL | 0 refills | Status: DC | PRN

## 2017-07-19 NOTE — Progress Notes (Signed)
Subjective:  Patient ID: Joanna Thompson, female    DOB: [birth year]  Age: 41 y.o. MRN: [id number]  CC: Cough (coughing in th last 2 wks. bodyache---was in [place]--concern it migth be something seriouse)   Cough  This is a new problem. The current episode started 1 to 4 weeks ago. The problem has been unchanged. The cough is non-productive. Associated symptoms include chest pain. Pertinent negatives include no chills, fever, heartburn, hemoptysis, nasal congestion, postnasal drip, rash, rhinorrhea, sore throat, shortness of breath, weight loss or wheezing. Risk factors: recent car ride to [place] (2weeks ago) She has tried nothing for the symptoms. There is no history of asthma, bronchitis, environmental allergies or pneumonia.  cough x 2weeks with body aches. Traveled to [place] 2weeks ago.   Outpatient Medications Prior to Visit  Medication Sig Dispense Refill  . ADDERALL XR 10 MG 24 hr capsule Take 10 mg by mouth 2 (two) times daily.     . AMBULATORY NON FORMULARY MEDICATION Medication Name: Nitroglycerin ointment 0.125% Apply TID prn 45 g 2  . Eszopiclone 3 MG TABS Take 1 tablet (3 mg total) by mouth at bedtime. 30 tablet 5  . FLUoxetine (PROZAC) 40 MG capsule Take 40 mg by mouth daily.    Marland Kitchen levonorgestrel (MIRENA) 20 MCG/24HR IUD 1 each by Intrauterine route once.    . Multiple Vitamin (MULTIVITAMIN) tablet Take 1 tablet by mouth daily.    Marland Kitchen SYNTHROID 125 MCG tablet Take 125 mcg by mouth at bedtime. 1/2 tablet once a week     No facility-administered medications prior to visit.     ROS See HPI  Objective:  BP 118/86   Pulse 75   Temp 98.6 F (37 C)   Ht 5\' 5"  (1.651 m)   Wt 133 lb (60.3 kg)   SpO2 95%   BMI 22.13 kg/m   BP Readings from Last 3 Encounters:  07/19/17 118/86  07/13/17 120/70  01/15/17 100/64    Wt Readings from Last 3 Encounters:  07/19/17 133 lb (60.3 kg)  07/13/17 132 lb 3.2 oz (60 kg)  01/15/17 130 lb (59 kg)    Physical Exam    Constitutional: She is oriented to person, place, and time. No distress.  HENT:  Right Ear: Tympanic membrane, external ear and ear canal normal.  Left Ear: Tympanic membrane, external ear and ear canal normal.  Nose: Nose normal.  Mouth/Throat: Oropharynx is clear and moist. No oropharyngeal exudate or posterior oropharyngeal erythema.  Cardiovascular: Normal rate.   Pulmonary/Chest: Effort normal and breath sounds normal.  Lymphadenopathy:    She has no cervical adenopathy.  Neurological: She is alert and oriented to person, place, and time.  Skin: Skin is warm and dry.  Vitals reviewed.   Lab Results  Component Value Date   WBC 5.3 01/15/2017   HGB 13.4 01/15/2017   HCT 40.5 01/15/2017   PLT 229.0 01/15/2017   GLUCOSE 89 01/15/2017   CHOL 186 01/15/2017   TRIG 52.0 01/15/2017   HDL 74.30 01/15/2017   LDLCALC 101 (H) 01/15/2017   ALT 10 01/15/2017   AST 16 01/15/2017   NA 137 01/15/2017   K 3.9 01/15/2017   CL 103 01/15/2017   CREATININE 0.89 01/15/2017   BUN 15 01/15/2017   CO2 28 01/15/2017   TSH 0.60 01/15/2017    US Thyroid  Result Date: 03/31/2017 CLINICAL DATA:  Prior ultrasound follow-up. History of right-sided thyroid nodule fine-needle aspiration - 04/24/2014 EXAM: THYROID ULTRASOUND TECHNIQUE: Ultrasound examination of the  thyroid gland and adjacent soft tissues was performed. COMPARISON:  04/12/2015; 04/05/2014; right-sided thyroid nodule fine-needle aspiration - 04/24/2014 FINDINGS: Parenchymal Echotexture: Moderately heterogenous Isthmus: Normal in size measuring 0.2 cm in diameter, unchanged Right lobe: Enlarged measuring 8.7 x 3.0 x 2.5 cm, unchanged, previously, 7.5 x 2.9 x 2.5 cm with slight differences likely total to scan plane projection. Left lobe: Normal in size measuring 4.5 x 1.6 x 1.8 cm, unchanged, previously, 4.9 x 1.4 x 1.4 cm _________________________________________________________ Estimated total number of nodules >/= 1 cm: 2 Number of  spongiform nodules >/=  2 cm not described below (TR1): 0 Number of mixed cystic and solid nodules >/= 1.5 cm not described below ([place]): 0 _________________________________________________________ The previously biopsied approximately 4.0 x 2.8 x 3.4 cm partially cystic, predominantly solid nodule within the right lobe of the thyroid is unchanged compared to the 04/05/2014 examination, previously, 4.8 x 2.9 x 4.7 cm. Correlation prior biopsy results is recommended. Nodule # 1: Prior biopsy: No Location: Isthmus; Mid Maximum size: 1.2 cm; Other 2 dimensions: 0.7 x 0.4 cm, previously, 0.7 x 0.4 cm Composition: solid/almost completely solid (2) Echogenicity: hypoechoic (2) Shape: not taller-than-wide (0) Margins: smooth (0) Echogenic foci: none (0) ACR TI-RADS total points: 4. ACR TI-RADS risk category:  TR4 (4-6 points). Significant change in size (>/= 20% in two dimensions and minimal increase of 2 mm): No - note, nodule was not measured and longitudinal projection on the previous examination Change in features: No Change in ACR TI-RADS risk category: No ACR TI-RADS recommendations: *Given size (>/= 1 - 1.4 cm) and appearance, a follow-up ultrasound in 1 year should be considered based on TI-RADS criteria. IMPRESSION: 1. Similar findings of multinodular goiter. No definitive new or enlarging thyroid nodules. 2. Nodule #1 within the right-side of the thyroid isthmus appears similar to the 03/2015 examination though meets imaging criteria to recommend a 1 year follow-up to ensure continued stability. 3. Previously biopsied approximately 4 cm nodule/mass within the inferior aspect of the right lobe of the thyroid is unchanged since the 03/2014 examination. Correlation with prior biopsy results is recommended. Assuming a benign pathologic diagnosis, repeat sampling and/or dedicated follow-up is not recommended. The above is in keeping with the ACR TI-RADS recommendations - J Am Coll Radiol 2017;14:587-595. Electronically  Signed   By: Sandi Mariscal M.D.   On: 03/31/2017 12:38    Assessment & Plan:   Joanna Thompson was seen today for cough.  Diagnoses and all orders for this visit:  Viral upper respiratory tract infection with cough -     DG Chest 2 View; Future -     benzonatate (TESSALON) 100 MG capsule; Take 1 capsule (100 mg total) by mouth 3 (three) times daily as needed for cough.  Malaise and fatigue -     DG Chest 2 View; Future   I am having Joanna Thompson start on benzonatate. I am also having her maintain her levonorgestrel, FLUoxetine, multivitamin, ADDERALL XR, SYNTHROID, Eszopiclone, AMBULATORY NON FORMULARY MEDICATION, and ADDERALL XR.  Meds ordered this encounter  Medications  . ADDERALL XR 15 MG 24 hr capsule    Sig: Take 15 mg by mouth daily.  . benzonatate (TESSALON) 100 MG capsule    Sig: Take 1 capsule (100 mg total) by mouth 3 (three) times daily as needed for cough.    Dispense:  20 capsule    Refill:  0    Order Specific Question:   Supervising Provider    Answer:   Cassandria Anger [  [id number]]    Follow-up: Return if symptoms worsen or fail to improve.  Wilfred Lacy, NP

## 2017-07-19 NOTE — Patient Instructions (Signed)
Normal CXR. Please use robitussin or delsym for cough. Sent benzonanate for cough. With low probability of PE and malignancy, I do not think we need additional testing at this time. Let use know if no improvement in another 2-4weeks or if develops any new symptoms

## 2017-07-21 ENCOUNTER — Encounter: Payer: Self-pay | Admitting: Physical Therapy

## 2017-07-21 ENCOUNTER — Ambulatory Visit: Payer: 59 | Attending: Gastroenterology | Admitting: Physical Therapy

## 2017-07-21 ENCOUNTER — Other Ambulatory Visit: Payer: Self-pay | Admitting: Internal Medicine

## 2017-07-21 DIAGNOSIS — M62838 Other muscle spasm: Secondary | ICD-10-CM | POA: Insufficient documentation

## 2017-07-21 DIAGNOSIS — R279 Unspecified lack of coordination: Secondary | ICD-10-CM | POA: Insufficient documentation

## 2017-07-21 DIAGNOSIS — M6281 Muscle weakness (generalized): Secondary | ICD-10-CM | POA: Insufficient documentation

## 2017-07-21 NOTE — Telephone Encounter (Signed)
Please advise per Dr. Crawfords absence. thanks 

## 2017-07-21 NOTE — Therapy (Signed)
[facility] [address] [place], [place], [address] Phone: [phone]   Fax:  [phone]  Physical Therapy Evaluation  Patient Details  Name: Joanna Thompson MRN: [id number] Date of Birth: 02-20-1976 Referring Provider: Yetta Flock  Encounter Date: 07/21/2017      PT End of Session - 07/21/17 1521    Visit Number 1   Date for PT Re-Evaluation 11/10/17   Authorization - Visit Number 1   Authorization - Number of Visits 20   PT Start Time 7989   PT Stop Time 2119   PT Time Calculation (min) 48 min   Activity Tolerance Patient tolerated treatment well      Past Medical History:  Diagnosis Date  . ADHD (attention deficit hyperactivity disorder)   . Anemia, iron deficiency   . Generalized headaches   . GERD (gastroesophageal reflux disease)   . H/O bulimia nervosa   . H/O bulimia nervosa   . History of chicken pox 1984  . Hypothyroidism   . Melanoma in situ [facility]) 2008   anterior abd - follows with derm annually  . Migraine   . Missed abortion     Past Surgical History:  Procedure Laterality Date  . PLACEMENT OF BREAST IMPLANTS    . RHINOPLASTY    . TONSILLECTOMY AND ADENOIDECTOMY      There were no vitals filed for this visit.       Subjective Assessment - 07/21/17 1459    Subjective December is when started having bleeding with bowel movements.  I have had straining with bowels for years.  BM are not regular, less during the week and more on the weekend.  Patient states she does not eat meat and feels a lot of spasm when eating vegetables, gluten, dairy, rice.  Have been using metamucel.  States she is having pain with BM and feels like every thing will blow out because of pushing so hard.   Limitations Other (comment)  toileting   Patient Stated Goals Be able to have bowel movement more easily   Currently in Pain? No/denies            [facility] PT Assessment - 07/21/17 0001      Assessment   Medical Diagnosis R19.4 (ICD-10-CM) - Altered bowel habits   Referring Provider ARMBRUSTER, STEVEN P   Prior Therapy no     Precautions   Precautions None     Restrictions   Weight Bearing Restrictions No     Balance Screen   Has the patient fallen in the past 6 months No     [place] residence   Living Arrangements Spouse/significant other     Prior Function   Level of Independence Independent   Vocation Full time employment   Vocation Requirements standing desk, walks at the desk     Cognition   Overall Cognitive Status Within Functional Limits for tasks assessed     Observation/Other Assessments   Focus on Therapeutic Outcomes (FOTO)  48% limited     Posture/Postural Control   Posture/Postural Control No significant limitations     Palpation   Palpation comment tight in upper abdomen     Ambulation/Gait   Gait Pattern Within Functional Limits            Objective measurements completed on examination: See above findings.        Pelvic Floor Special Questions - 07/21/17 0001    Are you Pregnant or  attempting pregnancy? No   Prior Pregnancies Yes   Number of Pregnancies 1   Number of Vaginal Deliveries 1   Any difficulty with labor and deliveries No   Currently Sexually Active Yes   Is this Painful No   Marinoff Scale no problems   Urinary Leakage No   Urinary urgency Yes   Urinary frequency yes   Fecal incontinence No   Fluid intake 126 oz   Caffeine beverages 1 cup   Falling out feeling (prolapse) Yes   Activities that cause feeling of prolapse running   Skin Integrity Hemorroids;Intact   Prolapse --   Pelvic Floor Internal Exam patient informed and consent given to perform internal assessment   Exam Type Rectal   Palpation external and internal sphincter muscles tight and tender to palpation   Strength Flicker  used mostly glutes   Strength # of reps 2   Strength # of seconds 3   Tone  mixed                  PT Education - 07/21/17 1602    Education provided Yes   Education Details abdominal massage, toilet techniques, samples of desert harvest   Person(s) Educated Patient   Methods Explanation;Demonstration;Verbal cues;Handout   Comprehension Verbalized understanding;Returned demonstration          PT Short Term Goals - 07/21/17 1621      PT SHORT TERM GOAL #1   Title pt will be able to perform abdominal massage and toilet techniques for improved bowel function   Time 4   Period Weeks   Status New   Target Date 08/18/17     PT SHORT TERM GOAL #2   Title pt will report 25% less force needed to have bowel movement   Time 4   Period Weeks   Status New   Target Date 08/18/17           PT Long Term Goals - 07/21/17 1632      PT LONG TERM GOAL #1   Title pt will be able to have bowel movement with 50% less force   Time 4   Period Months   Status New   Target Date 11/10/17     PT LONG TERM GOAL #2   Title FOTO < or = to 38% limited bowel cnst survey   Time 4   Period Months   Status New   Target Date 11/10/17     PT LONG TERM GOAL #3   Title Pt will be able to bulge pelvic floor correctly in order to have bowel movement with greater ease   Time 4   Period Months   Status New   Target Date 11/10/17     PT LONG TERM GOAL #4   Title will report ability to evacuate entire contents of rectum in 15 minutes or less due to improved muscle coordination and ability to relax muscles   Time 4   Period Months   Status New   Target Date 11/10/17                Plan - 07/21/17 1625    Clinical Impression Statement Patient reports to clinic due to difficulty with bowel movements.  She states that she has been having bleeding and needs to use excessive force to have BM.  Pt has very weak pelvic floor with 1/5 MMT contraction and uses glutes to compensate.  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  She is unable  to coordinate abdominal and  pelvic floor when pushing and bulges abdomen when attempting to evacuate contents of rectum.  Pt will benefit from skilled therapy and use of biofeedback to regain muscle length, strength and coordination and have improved bowel movements.   History and Personal Factors relevant to plan of care: n/a   Clinical Presentation Evolving   Clinical Presentation due to: has been getting worse, bleeding in past 8 months   Clinical Decision Making Low   Rehab Potential Excellent   Clinical Impairments Affecting Rehab Potential n/a   PT Frequency 1x / week   PT Duration Other (comment)  4 months   PT Treatment/Interventions ADLs/Self Care Home Management;Biofeedback;Cryotherapy;English as a second language teacher;Therapeutic activities;Therapeutic exercise;Balance training;Neuromuscular re-education;Patient/family education;Manual techniques;Dry needling;Taping;Passive range of motion   PT Next Visit Plan breathing, bulging, toilet meditation, internal STM to levator ani and anal sphincters   Consulted and Agree with Plan of Care Patient      Patient will benefit from skilled therapeutic intervention in order to improve the following deficits and impairments:  Increased muscle spasms, Pain, Increased fascial restricitons, Decreased coordination, Decreased strength  Visit Diagnosis: Muscle weakness (generalized)  Other muscle spasm  Unspecified lack of coordination     Problem List Patient Active Problem List   Diagnosis Date Noted  . Routine general medical examination at a health care facility 09/27/2015  . Rectal pain 08/10/2014  . Hashimoto's disease   . Migraine   . ADHD (attention deficit hyperactivity disorder)     Zannie Cove, PT 07/21/2017, 4:35 PM  [facility] [address] [place], [place], [address] Phone: [phone]   Fax:  [phone]  Name: Joanna Thompson MRN:  [id number] Date of Birth: 07-Jun-1976

## 2017-07-21 NOTE — Telephone Encounter (Signed)
Faxed script back to [facility]...Joanna Thompson

## 2017-07-21 NOTE — Patient Instructions (Signed)
About Abdominal Massage  Abdominal massage, also called external colon massage, is a self-treatment circular massage technique that can reduce and eliminate gas and ease constipation. The colon naturally contracts in waves in a clockwise direction starting from inside the right hip, moving up toward the ribs, across the belly, and down inside the left hip.  When you perform circular abdominal massage, you help stimulate your colon's normal wave pattern of movement called peristalsis.  It is most beneficial when done after eating.  Positioning You can practice abdominal massage with oil while lying down, or in the shower with soap.  Some people find that it is just as effective to do the massage through clothing while sitting or standing.  How to Massage Start by placing your finger tips or knuckles on your right side, just inside your hip bone.  . Make small circular movements while you move upward toward your rib cage.   . Once you reach the bottom right side of your rib cage, take your circular movements across to the left side of the bottom of your rib cage.  . Next, move downward until you reach the inside of your left hip bone.  This is the path your feces travel in your colon. . Continue to perform your abdominal massage in this pattern for 10 minutes each day.     You can apply as much pressure as is comfortable in your massage.  Start gently and build pressure as you continue to practice.  Notice any areas of pain as you massage; areas of slight pain may be relieved as you massage, but if you have areas of significant or intense pain, consult with your healthcare provider.  Other Considerations . General physical activity including bending and stretching can have a beneficial massage-like effect on the colon.  Deep breathing can also stimulate the colon because breathing deeply activates the same nervous system that supplies the colon.   . Abdominal massage should always be used in  combination with a bowel-conscious diet that is high in the proper type of fiber for you, fluids (primarily water), and a regular exercise program.  Toileting Techniques for Bowel Movements (Defecation) Using your belly (abdomen) and pelvic floor muscles to have a bowel movement is usually instinctive.  Sometimes people can have problems with these muscles and have to relearn proper defecation (emptying) techniques.  If you have weakness in your muscles, organs that are falling out, decreased sensation in your pelvis, or ignore your urge to go, you may find yourself straining to have a bowel movement.  You are straining if you are: . holding your breath or taking in a huge gulp of air and holding it  . keeping your lips and jaw tensed and closed tightly . turning red in the face because of excessive pushing or forcing . developing or worsening your  hemorrhoids . getting faint while pushing . not emptying completely and have to defecate many times a day  If you are straining, you are actually making it harder for yourself to have a bowel movement.  Many people find they are pulling up with the pelvic floor muscles and closing off instead of opening the anus. Due to lack pelvic floor relaxation and coordination the abdominal muscles, one has to work harder to push the feces out.  Many people have never been taught how to defecate efficiently and effectively.  Notice what happens to your body when you are having a bowel movement.  While you are sitting on  the toilet pay attention to the following areas: . Jaw and mouth position . Angle of your hips   . Whether your feet touch the ground or not . Arm placement  . Spine position . Waist . Belly tension . Anus (opening of the anal canal)  An Evacuation/Defecation Plan   Here are the 4 basic points:  1. Lean forward enough for your elbows to rest on your knees 2. Support your feet on the floor or use a low stool if your feet don't touch the  floor  3. Push out your belly as if you have swallowed a beach ball-you should feel a widening of your waist 4. Open and relax your pelvic floor muscles, rather than tightening around the anus      The following conditions my require modifications to your toileting posture:  . If you have had surgery in the past that limits your back, hip, pelvic, knee or ankle flexibility . Constipation   Your healthcare practitioner may make the following additional suggestions and adjustments:  1) Sit on the toilet  a) Make sure your feet are supported. b) Notice your hip angle and spine position-most people find it effective to lean forward or raise their knees, which can help the muscles around the anus to relax  c) When you lean forward, place your forearms on your thighs for support  2) Relax suggestions a) Breath deeply in through your nose and out slowly through your mouth as if you are smelling the flowers and blowing out the candles. b) To become aware of how to relax your muscles, contracting and releasing muscles can be helpful.  Pull your pelvic floor muscles in tightly by using the image of holding back gas, or closing around the anus (visualize making a circle smaller) and lifting the anus up and in.  Then release the muscles and your anus should drop down and feel open. Repeat 5 times ending with the feeling of relaxation. c) Keep your pelvic floor muscles relaxed; let your belly bulge out. d) The digestive tract starts at the mouth and ends at the anal opening, so be sure to relax both ends of the tube.  Place your tongue on the roof of your mouth with your teeth separated.  This helps relax your mouth and will help to relax the anus at the same time.  3) Empty (defecation) a) Keep your pelvic floor and sphincter relaxed, then bulge your anal muscles.  Make the anal opening wide.  b) Stick your belly out as if you have swallowed a beach ball. c) Make your belly wall hard using your belly  muscles while continuing to breathe. Doing this makes it easier to open your anus. d) Breath out and give a grunt (or try using other sounds such as ahhhh, shhhhh, ohhhh or grrrrrrr).  4) Finish a) As you finish your bowel movement, pull the pelvic floor muscles up and in.  This will leave your anus in the proper place rather than remaining pushed out and down. If you leave your anus pushed out and down, it will start to feel as though that is normal and give you incorrect signals about needing to have a bowel movement.    [facility] [address] [place], [place] [address]  

## 2017-08-04 ENCOUNTER — Ambulatory Visit: Payer: 59 | Attending: Gastroenterology | Admitting: Physical Therapy

## 2017-08-04 DIAGNOSIS — M62838 Other muscle spasm: Secondary | ICD-10-CM | POA: Diagnosis present

## 2017-08-04 DIAGNOSIS — R279 Unspecified lack of coordination: Secondary | ICD-10-CM | POA: Insufficient documentation

## 2017-08-04 DIAGNOSIS — M6281 Muscle weakness (generalized): Secondary | ICD-10-CM | POA: Insufficient documentation

## 2017-08-04 NOTE — Patient Instructions (Signed)
   Happy Baby Pose  Lying on your back, start off with legs up against wall. Bring one leg towards you with knee bent at 90 degrees and hold the inner foot. Repeat with other leg. Hold 30 sec 3x    Cow   Start Position: Align the hands under the shoulders with the scapula in neutral and arms in an arm spiral so the points of the elbows face backwards. The elbows should be straight, but not hyperextended. Stretch the fingers as wide as possible. Bring the knees under the hips at hip width and relax the feet with toes pointed. Hips widen with inhale do 10 breathes

## 2017-08-04 NOTE — Therapy (Addendum)
[facility] [address], [address] [place], [place], [address] Phone: [phone]   Fax:  [phone]  Physical Therapy Treatment  Patient Details  Name: Joanna Thompson MRN: [id number] Date of Birth: 04/09/1976 Referring Provider: ARMBRUSTER, STEVEN P  Encounter Date: 08/04/2017      PT End of Session - 08/04/17 1001    Visit Number 2   Authorization - Visit Number 2   Authorization - Number of Visits 20   PT Start Time 0934   PT Stop Time 1014   PT Time Calculation (min) 40 min   Activity Tolerance Patient tolerated treatment well      Past Medical History:  Diagnosis Date  . ADHD (attention deficit hyperactivity disorder)   . Anemia, iron deficiency   . Generalized headaches   . GERD (gastroesophageal reflux disease)   . H/O bulimia nervosa   . H/O bulimia nervosa   . History of chicken pox 1984  . Hypothyroidism   . Melanoma in situ (HCC) 2008   anterior abd - follows with derm annually  . Migraine   . Missed abortion     Past Surgical History:  Procedure Laterality Date  . PLACEMENT OF BREAST IMPLANTS    . RHINOPLASTY    . TONSILLECTOMY AND ADENOIDECTOMY      There were no vitals filed for this visit.      Subjective Assessment - 08/04/17 1001    Subjective I had a aching in the rectum last time I went running.  Been having liquid stools and not had to push as much.   Patient Stated Goals Be able to have bowel movement more easily   Currently in Pain? No/denies                         [facility] Adult PT Treatment/Exercise - 08/04/17 0001      Neuro Re-ed    Neuro Re-ed Details  breathing with stretch in HEP, breathing with toileting techniques and educated and performed good alignemnt     Manual Therapy   Manual Therapy Internal Pelvic Floor;Myofascial release   Manual therapy comments pt informed and consent given to perform internal massage   Myofascial Release abdominal fascial  release   Internal Pelvic Floor levator and coccygeus right > left                PT Education - 08/04/17 1021    Education provided Yes   Education Details happy baby, cat cow   Person(s) Educated Patient   Methods Explanation;Demonstration;Handout   Comprehension Verbalized understanding;Returned demonstration          PT Short Term Goals - 08/04/17 1025      PT SHORT TERM GOAL #1   Title pt will be able to perform abdominal massage and toilet techniques for improved bowel function   Time 4   Period Weeks   Status On-going     PT SHORT TERM GOAL #2   Title pt will report 25% less force needed to have bowel movement   Time 4   Period Weeks   Status On-going           PT Long Term Goals - 07/21/17 1632      PT LONG TERM GOAL #1   Title pt will be able to have bowel movement with 50% less force   Time 4   Period Months   Status New   Target Date 11/10/17       PT LONG TERM GOAL #2   Title FOTO < or = to 38% limited bowel cnst survey   Time 4   Period Months   Status New   Target Date 11/10/17     PT LONG TERM GOAL #3   Title Pt will be able to bulge pelvic floor correctly in order to have bowel movement with greater ease   Time 4   Period Months   Status New   Target Date 11/10/17     PT LONG TERM GOAL #4   Title will report ability to evacuate entire contents of rectum in 15 minutes or less due to improved muscle coordination and ability to relax muscles   Time 4   Period Months   Status New   Target Date 11/10/17               Plan - 08/04/17 1023    Clinical Impression Statement PT reviewed toilet techniques with patient and educated on doing consistently for a few minutes per day even if not feeling the urge to go. Right levator ani and coccygeus were very shortened and tight.  Rectum feels pooched out and slightly dropped.   Pt contineus to need skilled PT for increased muscle length and coordination for improved bowel function    Rehab Potential Excellent   PT Treatment/Interventions ADLs/Self Care Home Management;Biofeedback;Cryotherapy;English as a second language teacher;Therapeutic activities;Therapeutic exercise;Balance training;Neuromuscular re-education;Patient/family education;Manual techniques;Dry needling;Taping;Passive range of motion   PT Next Visit Plan breathing with bulging and tactile feedback rectally, toilet meditation, internal STM to levator ani and anal sphincters   Consulted and Agree with Plan of Care Patient      Patient will benefit from skilled therapeutic intervention in order to improve the following deficits and impairments:  Increased muscle spasms, Pain, Increased fascial restricitons, Decreased coordination, Decreased strength  Visit Diagnosis: Muscle weakness (generalized)  Other muscle spasm  Unspecified lack of coordination     Problem List Patient Active Problem List   Diagnosis Date Noted  . Routine general medical examination at a health care facility 09/27/2015  . Rectal pain 08/10/2014  . Hashimoto's disease   . Migraine   . ADHD (attention deficit hyperactivity disorder)     Zannie Cove, PT 08/04/2017, 10:32 AM  [facility] [address], [place], [place], [address] Phone: [phone]   Fax:  [phone]  Name: Joanna Thompson MRN: [id number] Date of Birth: June 24, 1976  PHYSICAL THERAPY DISCHARGE SUMMARY  Visits from Start of Care: 2  Current functional level related to goals / functional outcomes: See above   Remaining deficits: See above   Education / Equipment: HEP  Plan: Patient agrees to discharge.  Patient goals were not met. Patient is being discharged due to not returning since the last visit.  ?????         Google, PT 11/10/17 3:31 PM

## 2017-08-11 ENCOUNTER — Encounter: Payer: 59 | Admitting: Physical Therapy

## 2017-08-18 ENCOUNTER — Encounter: Payer: 59 | Admitting: Physical Therapy

## 2017-08-22 ENCOUNTER — Other Ambulatory Visit: Payer: Self-pay | Admitting: Internal Medicine

## 2017-08-24 NOTE — Telephone Encounter (Signed)
Faxed to [facility] on [address]

## 2017-08-25 ENCOUNTER — Encounter: Payer: 59 | Admitting: Physical Therapy

## 2018-02-16 ENCOUNTER — Ambulatory Visit (INDEPENDENT_AMBULATORY_CARE_PROVIDER_SITE_OTHER): Payer: 59 | Admitting: Obstetrics & Gynecology

## 2018-02-16 ENCOUNTER — Ambulatory Visit (INDEPENDENT_AMBULATORY_CARE_PROVIDER_SITE_OTHER): Payer: 59

## 2018-02-16 ENCOUNTER — Encounter: Payer: Self-pay | Admitting: Obstetrics & Gynecology

## 2018-02-16 ENCOUNTER — Other Ambulatory Visit: Payer: Self-pay | Admitting: Obstetrics & Gynecology

## 2018-02-16 VITALS — BP 118/76 | Ht 65.0 in | Wt 133.0 lb

## 2018-02-16 DIAGNOSIS — T8332XA Displacement of intrauterine contraceptive device, initial encounter: Secondary | ICD-10-CM

## 2018-02-16 DIAGNOSIS — Z1151 Encounter for screening for human papillomavirus (HPV): Secondary | ICD-10-CM

## 2018-02-16 DIAGNOSIS — Z30431 Encounter for routine checking of intrauterine contraceptive device: Secondary | ICD-10-CM

## 2018-02-16 DIAGNOSIS — D251 Intramural leiomyoma of uterus: Secondary | ICD-10-CM

## 2018-02-16 DIAGNOSIS — Z01419 Encounter for gynecological examination (general) (routine) without abnormal findings: Secondary | ICD-10-CM | POA: Diagnosis not present

## 2018-02-16 NOTE — Progress Notes (Signed)
Joanna Thompson 04-Jul-1976 [id number]   History:    42 y.o. G1P1L1 Married. Nurse Practitioner in Substance Abuse.  Daughter Joanna Thompson is in 4th grade.    RP:  Established patient presenting for annual gyn exam   HPI: Well on Mirena IUD, probably inserted in 2015, will confirm with [facility] Records.  No pelvic pain.  Normal vaginal secretions.  No pain with intercourse.  Urine and bowel movements normal.  Breasts normal.  Will schedule a screening mammogram now.  Body mass index 22.13.  Ran a half marathon mid February.  Followed by endocrinology for hypothyroidism.  Health labs with family physician.  Past medical history,surgical history, family history and social history were all reviewed and documented in the EPIC chart.  Gynecologic History No LMP recorded. (Menstrual status: IUD). Contraception:  Mirena IUD Last Pap: 2 yrs ago. Results were: normal Last mammogram: 3-4 yrs ago per patient. Results were: normal Bone Density: Never Colonoscopy: Never  Obstetric History OB History  Gravida Para Term Preterm AB Living  1 1       1   SAB TAB Ectopic Multiple Live Births               # Outcome Date GA Lbr Len/2nd Weight Sex Delivery Anes PTL Lv  1 Para              ROS: A ROS was performed and pertinent positives and negatives are included in the history.  GENERAL: No fevers or chills. HEENT: No change in vision, no earache, sore throat or sinus congestion. NECK: No pain or stiffness. CARDIOVASCULAR: No chest pain or pressure. No palpitations. PULMONARY: No shortness of breath, cough or wheeze. GASTROINTESTINAL: No abdominal pain, nausea, vomiting or diarrhea, melena or bright red blood per rectum. GENITOURINARY: No urinary frequency, urgency, hesitancy or dysuria. MUSCULOSKELETAL: No joint or muscle pain, no back pain, no recent trauma. DERMATOLOGIC: No rash, no itching, no lesions. ENDOCRINE: No polyuria, polydipsia, no heat or cold intolerance. No recent change in weight.  HEMATOLOGICAL: No anemia or easy bruising or bleeding. NEUROLOGIC: No headache, seizures, numbness, tingling or weakness. PSYCHIATRIC: No depression, no loss of interest in normal activity or change in sleep pattern.     Exam:   BP 118/76   Ht 5\' 5"  (1.651 m)   Wt 133 lb (60.3 kg)   BMI 22.13 kg/m   Body mass index is 22.13 kg/m.  General appearance : Well developed well nourished female. No acute distress HEENT: Eyes: no retinal hemorrhage or exudates,  Neck supple, trachea midline, no carotid bruits, no thyroidmegaly Lungs: Clear to auscultation, no rhonchi or wheezes, or rib retractions  Heart: Regular rate and rhythm, no murmurs or gallops Breast:Examined in sitting and supine position were symmetrical in appearance, no palpable masses or tenderness,  no skin retraction, no nipple inversion, no nipple discharge, no skin discoloration, no axillary or supraclavicular lymphadenopathy Abdomen: no palpable masses or tenderness, no rebound or guarding Extremities: no edema or skin discoloration or tenderness  Pelvic: Vulva: Normal             Vagina: No gross lesions or discharge  Cervix: No gross lesions or discharge.  IUD strings not visible.  Pap/HR HPV done.  Uterus  AV, normal size, shape and consistency, non-tender and mobile  Adnexa  Without masses or tenderness  Anus: Normal  Pelvic US today: T/V images.  Uterus anteverted heterogeneous measuring 8.36 x 5.26 x 3.68 cm.  2 small intramural fibroids measuring 1.4 x  1 cm and 1.1 x 1 cm.  Endometrial lining is thin at 3 mm.  IUD seen in normal intrauterine position.  Right and left ovaries normal.  No apparent mass in the right or left adnexa.  No free fluid in the posterior cul-de-sac.  Assessment/Plan:  42 y.o. female for annual exam   1. Encounter for routine gynecological examination with Papanicolaou smear of cervix Normal gynecologic exam.  Pelvic ultrasound done today to confirm normal intrauterine position of the IUD  showed 2 small intramural fibroids which was 1.4 cm and 1.1 cm.  Pelvic ultrasound otherwise negative.  Patient asymptomatic from uterine fibroids and reassured about the benign aspect, will observe.  Pap with high-risk HPV done.  Breast exam normal.  Patient will schedule screening mammogram at the breast center.  Hypothyroidism followed by Endocrinology.  Health labs with family physician.  Recommend regular aerobic and weightlifting physical activity.  Good body mass index at 22.13.  2. Encounter for routine checking of intrauterine contraceptive device (IUD) Mirena IUD well-tolerated.  Will verify date of insertion with [facility] records.  IUD strings not visualized.  IUD confirmed in normal intrauterine position by ultrasound.  Patient reassured.  3. Intrauterine contraceptive device threads lost, initial encounter Mirena IUD with lost strings.  IUD confirmed in normal intrauterine position by ultrasound.  Patient reassured. - US Transvaginal Non-OB  [name] MD, 9:48 AM 02/16/2018

## 2018-02-16 NOTE — Patient Instructions (Signed)
1. Encounter for routine gynecological examination with Papanicolaou smear of cervix Normal gynecologic exam.  Pelvic ultrasound done today to confirm normal intrauterine position of the IUD showed 2 small intramural fibroids which was 1.4 cm and 1.1 cm.  Pelvic ultrasound otherwise negative.  Patient asymptomatic from uterine fibroids and reassured about the benign aspect, will observe.  Pap with high-risk HPV done.  Breast exam normal.  Patient will schedule screening mammogram at the breast center.  Hypothyroidism followed by Endocrinology.  Health labs with family physician.  Recommend regular aerobic and weightlifting physical activity.  Good body mass index at 22.13.  2. Encounter for routine checking of intrauterine contraceptive device (IUD) Mirena IUD well-tolerated.  Will verify date of insertion with [facility] records.  IUD strings not visualized.  IUD confirmed in normal intrauterine position by ultrasound.  Patient reassured.  3. Intrauterine contraceptive device threads lost, initial encounter Mirena IUD with lost strings.  IUD confirmed in normal intrauterine position by ultrasound.  Patient reassured. - US Transvaginal Non-OB  Mateo Flow, un plaisir de te voir aujourd'hui!  Je te communiquerai tes resultats aussitot que disponibles.  Health Maintenance, Female Adopting a healthy lifestyle and getting preventive care can go a long way to promote health and wellness. Talk with your health care provider about what schedule of regular examinations is right for you. This is a good chance for you to check in with your provider about disease prevention and staying healthy. In between checkups, there are plenty of things you can do on your own. Experts have done a lot of research about which lifestyle changes and preventive measures are most likely to keep you healthy. Ask your health care provider for more information. Weight and diet Eat a healthy diet  Be sure to include  plenty of vegetables, fruits, low-fat dairy products, and lean protein.  Do not eat a lot of foods high in solid fats, added sugars, or salt.  Get regular exercise. This is one of the most important things you can do for your health. ? Most adults should exercise for at least 150 minutes each week. The exercise should increase your heart rate and make you sweat (moderate-intensity exercise). ? Most adults should also do strengthening exercises at least twice a week. This is in addition to the moderate-intensity exercise.  Maintain a healthy weight  Body mass index (BMI) is a measurement that can be used to identify possible weight problems. It estimates body fat based on height and weight. Your health care provider can help determine your BMI and help you achieve or maintain a healthy weight.  For females 59 years of age and older: ? A BMI below 18.5 is considered underweight. ? A BMI of 18.5 to 24.9 is normal. ? A BMI of 25 to 29.9 is considered overweight. ? A BMI of 30 and above is considered obese.  Watch levels of cholesterol and blood lipids  You should start having your blood tested for lipids and cholesterol at 41 years of age, then have this test every 5 years.  You may need to have your cholesterol levels checked more often if: ? Your lipid or cholesterol levels are high. ? You are older than 41 years of age. ? You are at high risk for heart disease.  Cancer screening Lung Cancer  Lung cancer screening is recommended for adults 4-3 years old who are at high risk for lung cancer because of a history of smoking.  A yearly low-dose CT scan of the  lungs is recommended for people who: ? Currently smoke. ? Have quit within the past 15 years. ? Have at least a 30-pack-year history of smoking. A pack year is smoking an average of one pack of cigarettes a day for 1 year.  Yearly screening should continue until it has been 15 years since you quit.  Yearly screening should  stop if you develop a health problem that would prevent you from having lung cancer treatment.  Breast Cancer  Practice breast self-awareness. This means understanding how your breasts normally appear and feel.  It also means doing regular breast self-exams. Let your health care provider know about any changes, no matter how small.  If you are in your 20s or 30s, you should have a clinical breast exam (CBE) by a health care provider every 1-3 years as part of a regular health exam.  If you are 44 or older, have a CBE every year. Also consider having a breast X-ray (mammogram) every year.  If you have a family history of breast cancer, talk to your health care provider about genetic screening.  If you are at high risk for breast cancer, talk to your health care provider about having an MRI and a mammogram every year.  Breast cancer gene (BRCA) assessment is recommended for women who have family members with BRCA-related cancers. BRCA-related cancers include: ? Breast. ? Ovarian. ? Tubal. ? Peritoneal cancers.  Results of the assessment will determine the need for genetic counseling and BRCA1 and BRCA2 testing.  Cervical Cancer Your health care provider may recommend that you be screened regularly for cancer of the pelvic organs (ovaries, uterus, and vagina). This screening involves a pelvic examination, including checking for microscopic changes to the surface of your cervix (Pap test). You may be encouraged to have this screening done every 3 years, beginning at age 2.  For women ages 67-65, health care providers may recommend pelvic exams and Pap testing every 3 years, or they may recommend the Pap and pelvic exam, combined with testing for human papilloma virus (HPV), every 5 years. Some types of HPV increase your risk of cervical cancer. Testing for HPV may also be done on women of any age with unclear Pap test results.  Other health care providers may not recommend any screening for  nonpregnant women who are considered low risk for pelvic cancer and who do not have symptoms. Ask your health care provider if a screening pelvic exam is right for you.  If you have had past treatment for cervical cancer or a condition that could lead to cancer, you need Pap tests and screening for cancer for at least 20 years after your treatment. If Pap tests have been discontinued, your risk factors (such as having a new sexual partner) need to be reassessed to determine if screening should resume. Some women have medical problems that increase the chance of getting cervical cancer. In these cases, your health care provider may recommend more frequent screening and Pap tests.  Colorectal Cancer  This type of cancer can be detected and often prevented.  Routine colorectal cancer screening usually begins at 41 years of age and continues through 41 years of age.  Your health care provider may recommend screening at an earlier age if you have risk factors for colon cancer.  Your health care provider may also recommend using home test kits to check for hidden blood in the stool.  A small camera at the end of a tube can be used to  examine your colon directly (sigmoidoscopy or colonoscopy). This is done to check for the earliest forms of colorectal cancer.  Routine screening usually begins at age 37.  Direct examination of the colon should be repeated every 5-10 years through 41 years of age. However, you may need to be screened more often if early forms of precancerous polyps or small growths are found.  Skin Cancer  Check your skin from head to toe regularly.  Tell your health care provider about any new moles or changes in moles, especially if there is a change in a mole's shape or color.  Also tell your health care provider if you have a mole that is larger than the size of a pencil eraser.  Always use sunscreen. Apply sunscreen liberally and repeatedly throughout the day.  Protect  yourself by wearing long sleeves, pants, a wide-brimmed hat, and sunglasses whenever you are outside.  Heart disease, diabetes, and high blood pressure  High blood pressure causes heart disease and increases the risk of stroke. High blood pressure is more likely to develop in: ? People who have blood pressure in the high end of the normal range (130-139/85-89 mm Hg). ? People who are overweight or obese. ? People who are African American.  If you are 47-61 years of age, have your blood pressure checked every 3-5 years. If you are 29 years of age or older, have your blood pressure checked every year. You should have your blood pressure measured twice-once when you are at a hospital or clinic, and once when you are not at a hospital or clinic. Record the average of the two measurements. To check your blood pressure when you are not at a hospital or clinic, you can use: ? An automated blood pressure machine at a pharmacy. ? A home blood pressure monitor.  If you are between 80 years and 77 years old, ask your health care provider if you should take aspirin to prevent strokes.  Have regular diabetes screenings. This involves taking a blood sample to check your fasting blood sugar level. ? If you are at a normal weight and have a low risk for diabetes, have this test once every three years after 41 years of age. ? If you are overweight and have a high risk for diabetes, consider being tested at a younger age or more often. Preventing infection Hepatitis B  If you have a higher risk for hepatitis B, you should be screened for this virus. You are considered at high risk for hepatitis B if: ? You were born in a country where hepatitis B is common. Ask your health care provider which countries are considered high risk. ? Your parents were born in a high-risk country, and you have not been immunized against hepatitis B (hepatitis B vaccine). ? You have HIV or AIDS. ? You use needles to inject street  drugs. ? You live with someone who has hepatitis B. ? You have had sex with someone who has hepatitis B. ? You get hemodialysis treatment. ? You take certain medicines for conditions, including cancer, organ transplantation, and autoimmune conditions.  Hepatitis C  Blood testing is recommended for: ? Everyone born from [birth year] through 1964. ? Anyone with known risk factors for hepatitis C.  Sexually transmitted infections (STIs)  You should be screened for sexually transmitted infections (STIs) including gonorrhea and chlamydia if: ? You are sexually active and are younger than 41 years of age. ? You are older than 41 years of age and  your health care provider tells you that you are at risk for this type of infection. ? Your sexual activity has changed since you were last screened and you are at an increased risk for chlamydia or gonorrhea. Ask your health care provider if you are at risk.  If you do not have HIV, but are at risk, it may be recommended that you take a prescription medicine daily to prevent HIV infection. This is called pre-exposure prophylaxis (PrEP). You are considered at risk if: ? You are sexually active and do not regularly use condoms or know the HIV status of your partner(s). ? You take drugs by injection. ? You are sexually active with a partner who has HIV.  Talk with your health care provider about whether you are at high risk of being infected with HIV. If you choose to begin PrEP, you should first be tested for HIV. You should then be tested every 3 months for as long as you are taking PrEP. Pregnancy  If you are premenopausal and you may become pregnant, ask your health care provider about preconception counseling.  If you may become pregnant, take 400 to 800 micrograms (mcg) of folic acid every day.  If you want to prevent pregnancy, talk to your health care provider about birth control (contraception). Osteoporosis and menopause  Osteoporosis is a disease  in which the bones lose minerals and strength with aging. This can result in serious bone fractures. Your risk for osteoporosis can be identified using a bone density scan.  If you are 58 years of age or older, or if you are at risk for osteoporosis and fractures, ask your health care provider if you should be screened.  Ask your health care provider whether you should take a calcium or vitamin D supplement to lower your risk for osteoporosis.  Menopause may have certain physical symptoms and risks.  Hormone replacement therapy may reduce some of these symptoms and risks. Talk to your health care provider about whether hormone replacement therapy is right for you. Follow these instructions at home:  Schedule regular health, dental, and eye exams.  Stay current with your immunizations.  Do not use any tobacco products including cigarettes, chewing tobacco, or electronic cigarettes.  If you are pregnant, do not drink alcohol.  If you are breastfeeding, limit how much and how often you drink alcohol.  Limit alcohol intake to no more than 1 drink per day for nonpregnant women. One drink equals 12 ounces of beer, 5 ounces of wine, or 1 ounces of hard liquor.  Do not use street drugs.  Do not share needles.  Ask your health care provider for help if you need support or information about quitting drugs.  Tell your health care provider if you often feel depressed.  Tell your health care provider if you have ever been abused or do not feel safe at home. This information is not intended to replace advice given to you by your health care provider. Make sure you discuss any questions you have with your health care provider. Document Released: 05/25/2011 Document Revised: 04/16/2016 Document Reviewed: 08/13/2015 Elsevier Interactive Patient Education  [employer].

## 2018-02-17 LAB — PAP, TP IMAGING W/ HPV RNA, RFLX HPV TYPE 16,18/45: HPV DNA High Risk: NOT DETECTED

## 2018-03-05 ENCOUNTER — Other Ambulatory Visit: Payer: Self-pay | Admitting: Internal Medicine

## 2018-03-08 NOTE — Telephone Encounter (Signed)
Control database checked last refill: 12/29/2017 LOV: 07/19/2017 acute with [facility] 01/15/2017 CPE

## 2018-03-09 ENCOUNTER — Other Ambulatory Visit: Payer: Self-pay | Admitting: Internal Medicine

## 2018-03-09 DIAGNOSIS — E042 Nontoxic multinodular goiter: Secondary | ICD-10-CM

## 2018-03-18 ENCOUNTER — Ambulatory Visit
Admission: RE | Admit: 2018-03-18 | Discharge: 2018-03-18 | Disposition: A | Discharge status: Home / Self Care | Payer: Self-pay | Source: Ambulatory Visit | Attending: Internal Medicine | Admitting: Internal Medicine

## 2018-03-18 DIAGNOSIS — E042 Nontoxic multinodular goiter: Secondary | ICD-10-CM

## 2018-03-23 ENCOUNTER — Ambulatory Visit (INDEPENDENT_AMBULATORY_CARE_PROVIDER_SITE_OTHER): Payer: 59 | Admitting: Internal Medicine

## 2018-03-23 ENCOUNTER — Other Ambulatory Visit (INDEPENDENT_AMBULATORY_CARE_PROVIDER_SITE_OTHER): Payer: 59

## 2018-03-23 ENCOUNTER — Encounter: Payer: Self-pay | Admitting: Internal Medicine

## 2018-03-23 VITALS — BP 100/80 | HR 67 | Temp 98.2°F | Ht 65.0 in | Wt 131.0 lb

## 2018-03-23 DIAGNOSIS — Z0000 Encounter for general adult medical examination without abnormal findings: Secondary | ICD-10-CM | POA: Diagnosis not present

## 2018-03-23 DIAGNOSIS — E063 Autoimmune thyroiditis: Secondary | ICD-10-CM | POA: Diagnosis not present

## 2018-03-23 LAB — COMPREHENSIVE METABOLIC PANEL
ALBUMIN: 4.2 g/dL (ref 3.5–5.2)
ALK PHOS: 34 U/L — AB (ref 39–117)
ALT: 13 U/L (ref 0–35)
AST: 16 U/L (ref 0–37)
BILIRUBIN TOTAL: 0.9 mg/dL (ref 0.2–1.2)
BUN: 19 mg/dL (ref 6–23)
CO2: 28 mEq/L (ref 19–32)
CREATININE: 0.9 mg/dL (ref 0.40–1.20)
Calcium: 9 mg/dL (ref 8.4–10.5)
Chloride: 102 mEq/L (ref 96–112)
GFR: 73.29 mL/min (ref >60.00)
GLUCOSE: 86 mg/dL (ref 70–99)
Potassium: 3.7 mEq/L (ref 3.5–5.1)
Sodium: 137 mEq/L (ref 135–145)
Total Protein: 6.6 g/dL (ref 6.0–8.3)

## 2018-03-23 LAB — VITAMIN D 25 HYDROXY (VIT D DEFICIENCY, FRACTURES): VITD: 33.18 ng/mL (ref 30.00–100.00)

## 2018-03-23 LAB — LIPID PANEL
CHOLESTEROL: 193 mg/dL (ref 0–200)
HDL: 86.2 mg/dL (ref >39.00)
LDL Cholesterol: 92 mg/dL (ref 0–99)
NONHDL: 106.89
Total CHOL/HDL Ratio: 2
Triglycerides: 74 mg/dL (ref 0.0–149.0)
VLDL: 14.8 mg/dL (ref 0.0–40.0)

## 2018-03-23 LAB — CBC
HEMATOCRIT: 40.6 % (ref 36.0–46.0)
Hemoglobin: 13.9 g/dL (ref 12.0–15.0)
MCHC: 34.3 g/dL (ref 30.0–36.0)
MCV: 89.5 fl (ref 78.0–100.0)
Platelets: 199 10*3/uL (ref 150.0–400.0)
RBC: 4.53 Mil/uL (ref 3.87–5.11)
RDW: 13.3 % (ref 11.5–15.5)
WBC: 6.3 10*3/uL (ref 4.0–10.5)

## 2018-03-23 LAB — VITAMIN B12: VITAMIN B 12: 332 pg/mL (ref 211–911)

## 2018-03-23 LAB — FERRITIN: FERRITIN: 24.5 ng/mL (ref 10.0–291.0)

## 2018-03-23 NOTE — Assessment & Plan Note (Signed)
Seeing endo and recent thyroid US stable.

## 2018-03-23 NOTE — Progress Notes (Signed)
   Subjective:    Patient ID: Joanna Thompson, female    DOB: 1976/04/06, 42 y.o.   MRN: [id number]  HPI The patient is a 42 YO female coming in for physical. No new concerns except chronic stomach problems likely ibs-c.  PMH, [place], social history reviewed and updated  Review of Systems  Constitutional: Negative.   HENT: Negative.   Eyes: Negative.   Respiratory: Negative for cough, chest tightness and shortness of breath.   Cardiovascular: Negative for chest pain, palpitations and leg swelling.  Gastrointestinal: Negative for abdominal distention, abdominal pain, constipation, diarrhea, nausea and vomiting.  Musculoskeletal: Negative.   Skin: Negative.   Neurological: Negative.   Psychiatric/Behavioral: Negative.       Objective:   Physical Exam  Constitutional: She is oriented to person, place, and time. She appears well-developed and well-nourished.  HENT:  Head: Normocephalic and atraumatic.  Eyes: EOM are normal.  Neck: Normal range of motion.  Cardiovascular: Normal rate and regular rhythm.  Pulmonary/Chest: Effort normal and breath sounds normal. No respiratory distress. She has no wheezes. She has no rales.  Abdominal: Soft. Bowel sounds are normal. She exhibits no distension. There is no tenderness. There is no rebound.  Musculoskeletal: She exhibits no edema.  Neurological: She is alert and oriented to person, place, and time. Coordination normal.  Skin: Skin is warm and dry.  Psychiatric: She has a normal mood and affect.   Vitals:   03/23/18 0758  BP: 100/80  Pulse: 67  Temp: 98.2 F (36.8 C)  TempSrc: Oral  SpO2: 98%  Weight: 131 lb (59.4 kg)  Height: 5\' 5"  (1.651 m)      Assessment & Plan:

## 2018-03-23 NOTE — Assessment & Plan Note (Signed)
Checking labs, tdap up to date. Influenza allergy. Pap and mammogram with gyn. Counseled about sun safety and mole surveillance. Given health screening information.

## 2018-03-23 NOTE — Patient Instructions (Signed)

## 2018-04-15 ENCOUNTER — Other Ambulatory Visit: Payer: Self-pay | Admitting: Internal Medicine

## 2018-04-15 MED ORDER — ESZOPICLONE 3 MG PO TABS
3.0000 mg | ORAL_TABLET | Freq: Every day | ORAL | 3 refills | Status: DC
Start: 2018-04-15 — End: 2018-09-02

## 2018-05-20 IMAGING — US US THYROID
1 series · 13 of 25 positions shown · non-contrast
Comparison: Most recent prior thyroid ultrasound 03/31/2017

CLINICAL DATA: Goiter. 42-year-old female with a history of thyroid
nodules. She underwent biopsy of the dominant right-sided thyroid
nodule on 04/24/2014.

EXAM:
THYROID ULTRASOUND
TECHNIQUE: Ultrasound examination of the thyroid gland and adjacent soft
tissues was performed.

[Series 1: us thyroid · 0.04mm/px · 13 of 50 slices shown]
[im 1/50]
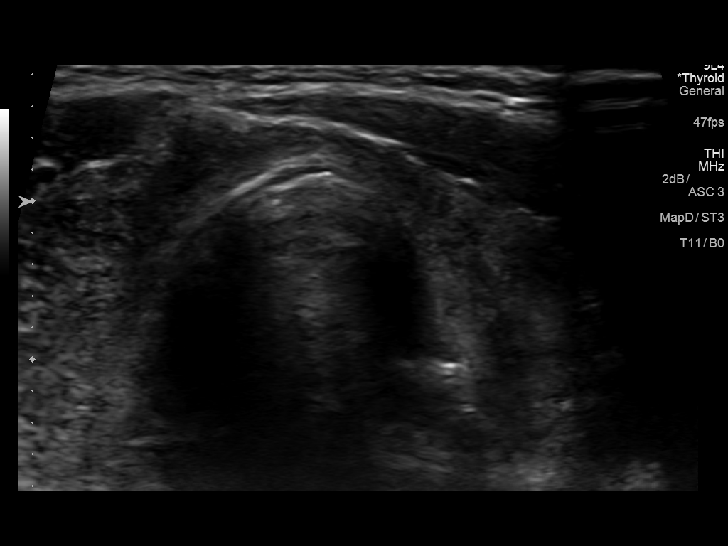
[im 5/50]
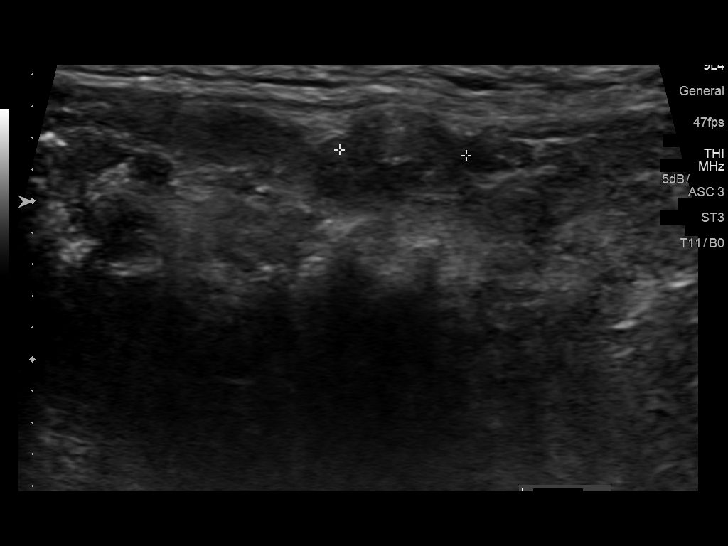
[im 9/50]
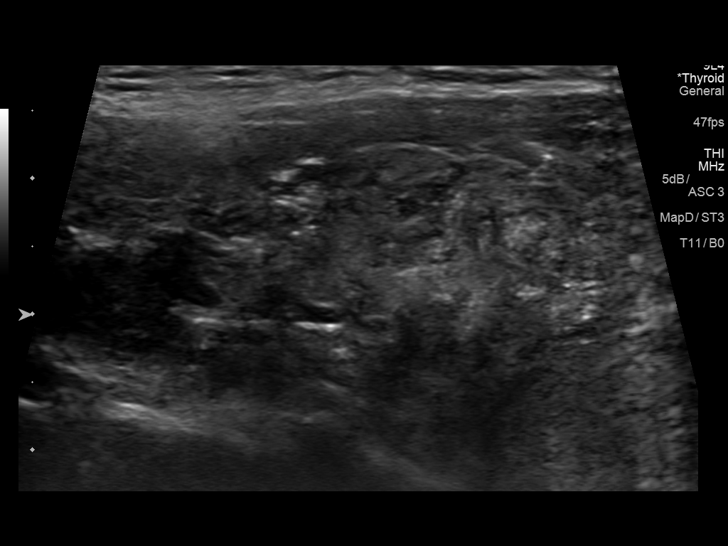
[im 13/50]
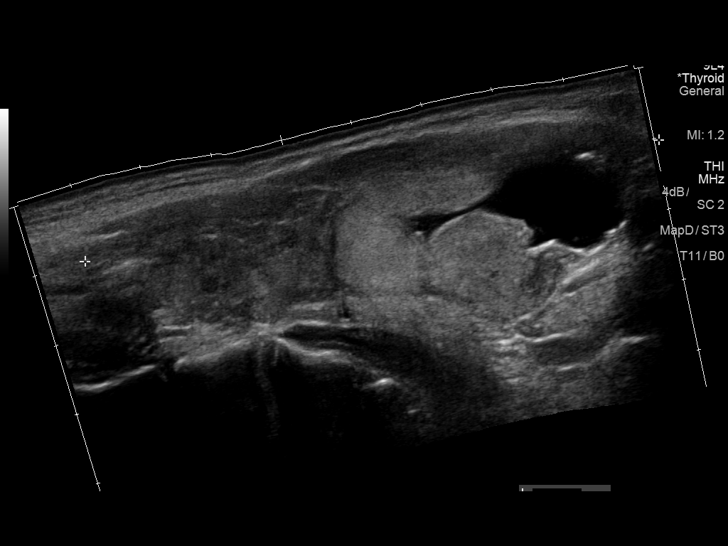
[im 17/50]
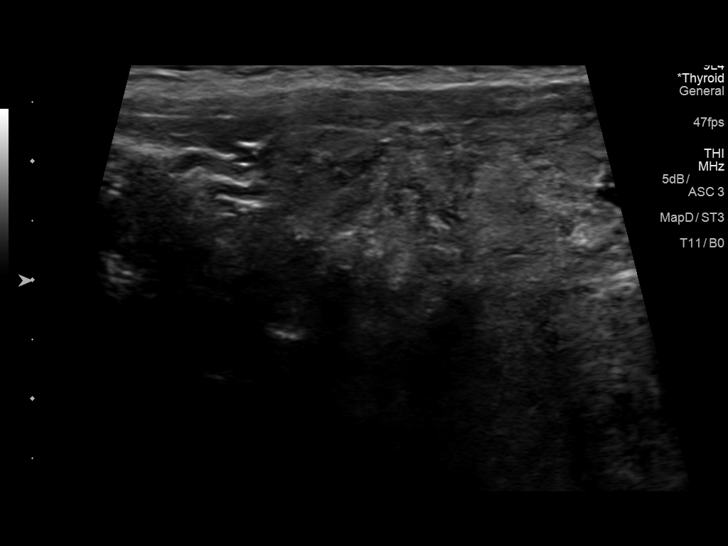
[im 21/50]
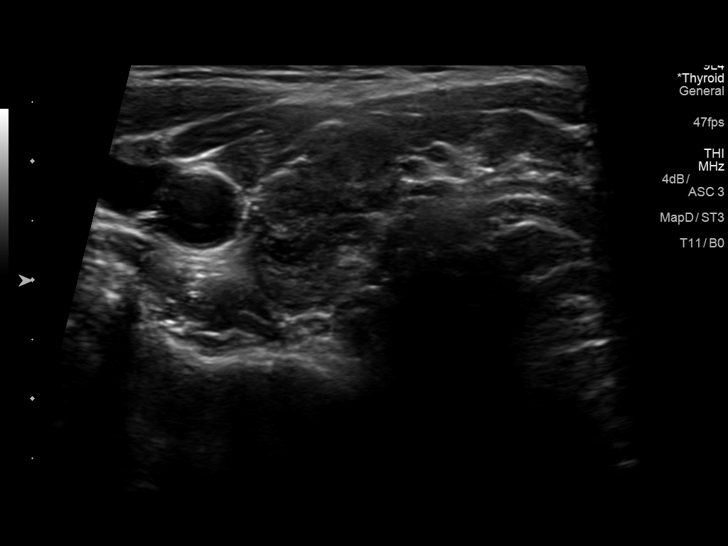
[im 25/50]
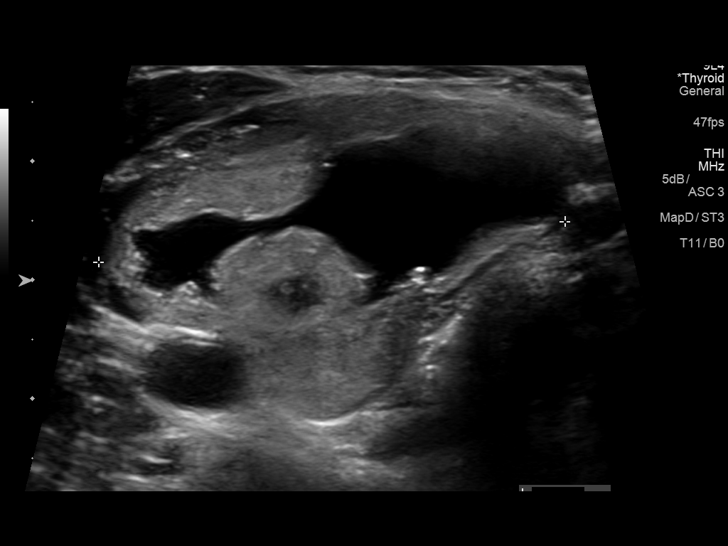
[im 29/50]
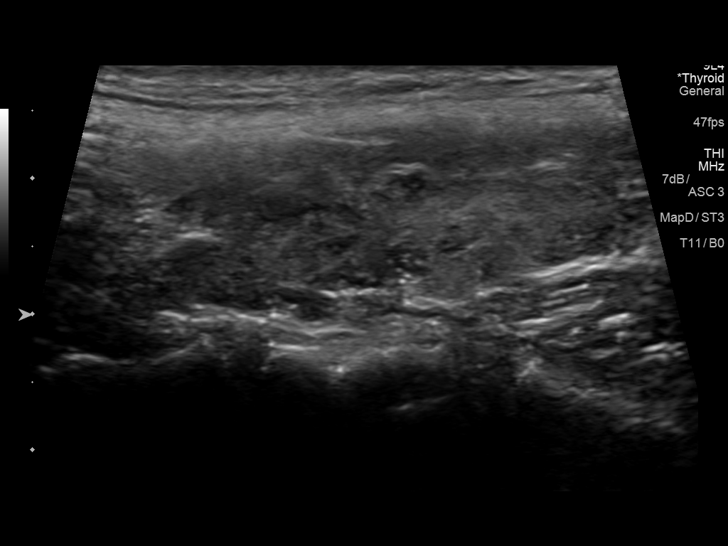
[im 33/50]
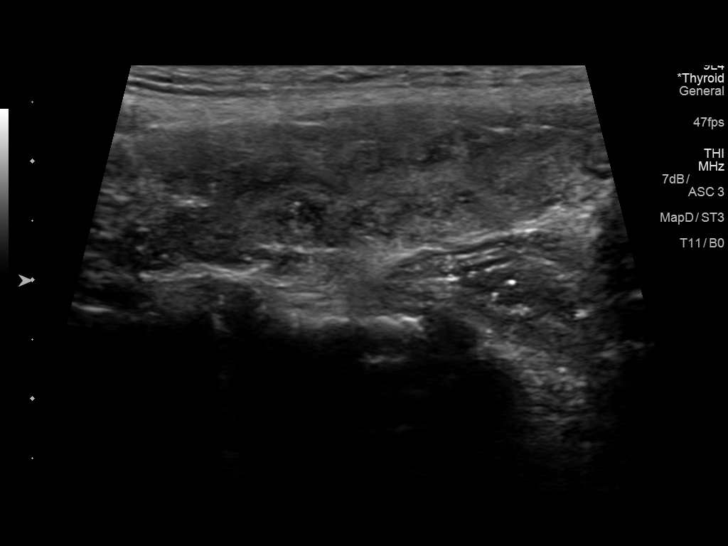
[im 37/50]
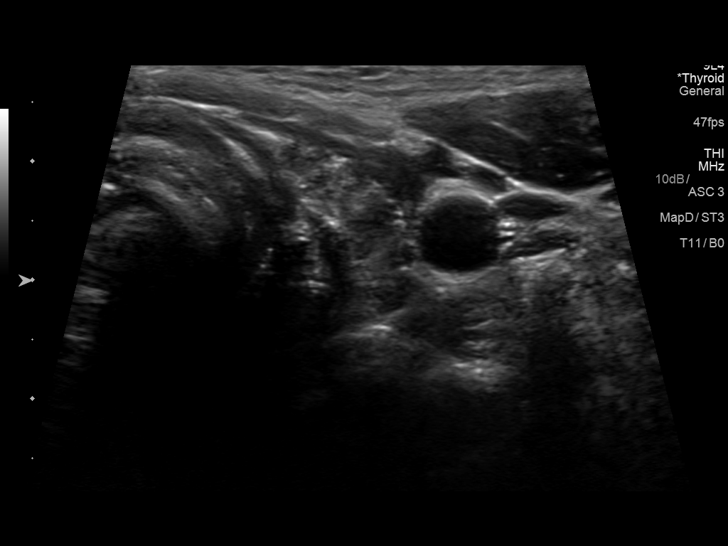
[im 41/50]
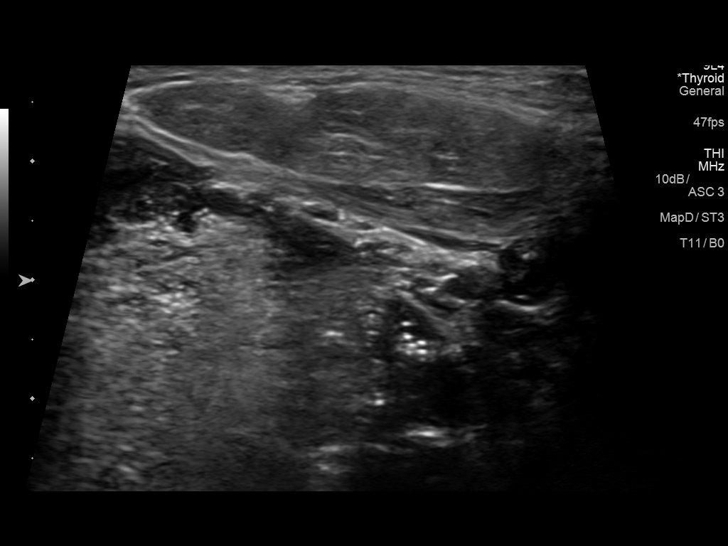
[im 45/50]
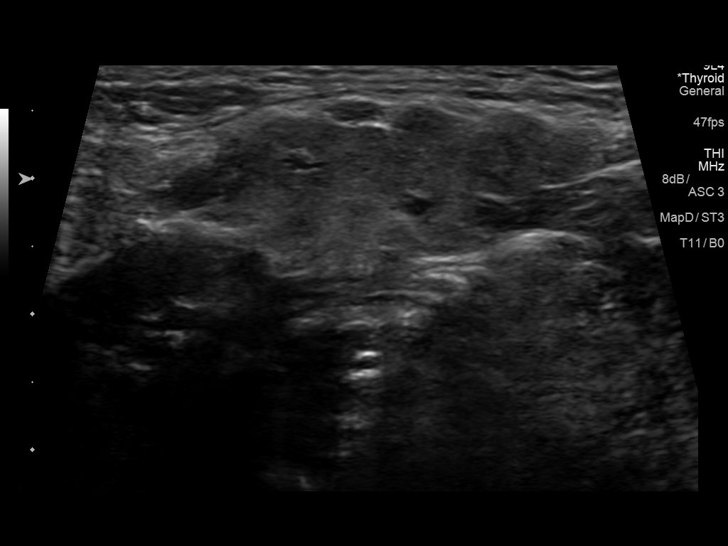
[im 50/50]
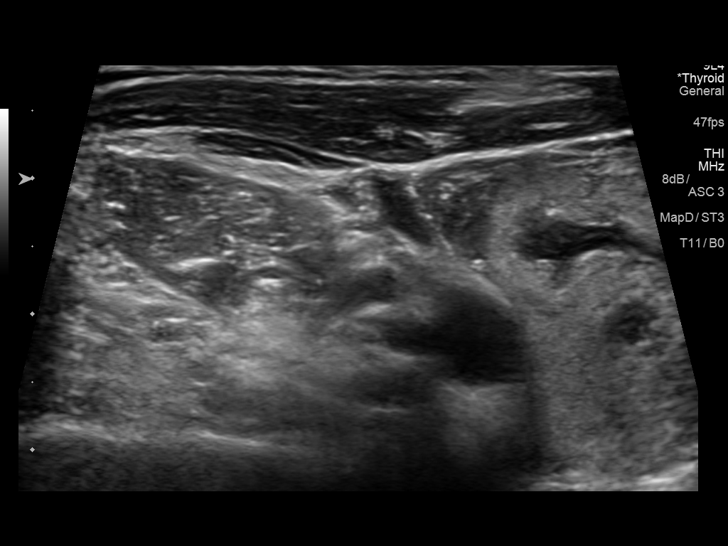

[13 of 25 positions shown; findings below may reference images not displayed]

FINDINGS: Parenchymal Echotexture: Markedly heterogenous

Isthmus: 0.2 cm

Right lobe: 8.1 x 2.8 x 3.3 cm

Left lobe: 4.3 x 1.4 x 1.1 cm

_________________________________________________________

Estimated total number of nodules >/= 1 cm: 1

Number of spongiform nodules >/=  2 cm not described below (TR1): 0

Number of mixed cystic and solid nodules >/= 1.5 cm not described
below (TR2): 0

_________________________________________________________

The previously biopsied mass occupying the majority of the right
inferior gland is essentially unchanged in size and appearance
presently measuring 4.3 by 3.9 x 2.8 cm compared to 4.1 x 3.4 x
cm previously. Size differences are likely due to differences in
measurement technique and a slight expansion of the simple cystic
component of the nodule.

Small involuting nodule in the right aspect of the thyroid isthmus
measures 0.8 x 0.6 x 0.4 cm compared to 1.2 x 0.7 x 0.4 cm
previously. This nodule no longer meets criteria for further
follow-up.
IMPRESSION: 1. Interval involution of the small nodule in the right aspect of
the thyroid isthmus consistent with a benign process. No further
follow-up required.
2. Essentially stable previously biopsied thyroid mass in the right
inferior gland.

The above is in keeping with the ACR TI-RADS recommendations - [HOSPITAL] 4898;[DATE].

## 2018-09-02 ENCOUNTER — Other Ambulatory Visit: Payer: Self-pay | Admitting: Internal Medicine

## 2018-09-02 NOTE — Telephone Encounter (Signed)
Control database checked last refill: 08/07/2018  LOV:03/23/2018 [id number]

## 2018-11-04 ENCOUNTER — Other Ambulatory Visit: Payer: Self-pay | Admitting: Internal Medicine

## 2018-11-04 ENCOUNTER — Ambulatory Visit
Admission: RE | Admit: 2018-11-04 | Discharge: 2018-11-04 | Disposition: A | Discharge status: Home / Self Care | Payer: 59 | Source: Ambulatory Visit | Attending: Internal Medicine | Admitting: Internal Medicine

## 2018-11-04 DIAGNOSIS — K5904 Chronic idiopathic constipation: Secondary | ICD-10-CM

## 2019-01-26 ENCOUNTER — Encounter: Payer: Self-pay | Admitting: Obstetrics & Gynecology

## 2019-01-26 ENCOUNTER — Ambulatory Visit: Payer: 59 | Admitting: Obstetrics & Gynecology

## 2019-01-26 VITALS — BP 120/80

## 2019-01-26 DIAGNOSIS — N6315 Unspecified lump in the right breast, overlapping quadrants: Secondary | ICD-10-CM

## 2019-01-26 DIAGNOSIS — Z30431 Encounter for routine checking of intrauterine contraceptive device: Secondary | ICD-10-CM | POA: Diagnosis not present

## 2019-01-26 DIAGNOSIS — N6310 Unspecified lump in the right breast, unspecified quadrant: Secondary | ICD-10-CM | POA: Diagnosis not present

## 2019-01-26 NOTE — Progress Notes (Signed)
    Joanna Thompson 23-Apr-1976 [id number]        43 y.o.  G1P1L1 Married.  Daughter [name] in 5th grade  RP: Rt breast lump increasing in size x 11/2018  HPI:  Rt breast lump felt x 11/2018, feels that it is increasing in size since then.  Occasionally tender. No nipple discharge. Skin normal.  S/P Bilateral breast augmentation.  Last Mammo 2017.  Second degree relatives with Breast Ca.  Well on Mirena IUD x 2015.  Very light menses.  No pelvic pain.  Urine normal.  Possible IBS, recent colonoscopy normal.     OB History  Gravida Para Term Preterm AB Living  1 1       1   SAB TAB Ectopic Multiple Live Births               # Outcome Date GA Lbr Len/2nd Weight Sex Delivery Anes PTL Lv  1 Para             Past medical history,surgical history, problem list, medications, allergies, family history and social history were all reviewed and documented in the EPIC chart.   Directed ROS with pertinent positives and negatives documented in the history of present illness/assessment and plan.  Exam:  Vitals:   01/26/19 1241  BP: 120/80   General appearance:  Normal  Breast exam:  Left Normal (Implant behind the muscle).  Right mass 2 x 3 cm at 11-12 O'clock close to [place].  Mobile, mildly tender.  Skin normal.  No nipple discharge.  Axillae Rt and Lt with no LN felt.   Assessment/Plan:  43 y.o. G1P1   1. Breast lump on right side at 12 o'clock position Rt breast mass 2 x 3 cm at 11-12 O'clock.  Increasing in size x 11/2018, mobile.  Will proceed with a Rt Dx mammo/US, possible fibro-cystic area, r/o a malignant process.  Counseling on breast mass done.  2. Encounter for routine checking of intrauterine contraceptive device (IUD) Well on Mirena IUD x 2015, will f/u to remove/insert a new Mirena IUD.  Counseling on above issues and coordination of care >50% x 25 minutes.  [name] MD, 12:46 PM 01/26/2019

## 2019-01-26 NOTE — Patient Instructions (Signed)
1. Breast lump on right side at 12 o'clock position Rt breast mass 2 x 3 cm at 11-12 O'clock.  Increasing in size x 11/2018, mobile.  Will proceed with a Rt Dx mammo/US, possible fibro-cystic area, r/o a malignant process.  Counseling on breast mass done.  2. Encounter for routine checking of intrauterine contraceptive device (IUD) Well on Mirena IUD x 2015, will f/u to remove/insert a new Mirena IUD.  Mateo Flow, un plaisir de te voir aujourd'hui!

## 2019-01-27 ENCOUNTER — Other Ambulatory Visit: Payer: Self-pay | Admitting: Obstetrics and Gynecology

## 2019-01-27 ENCOUNTER — Telehealth: Payer: Self-pay | Admitting: *Deleted

## 2019-01-27 DIAGNOSIS — N6310 Unspecified lump in the right breast, unspecified quadrant: Principal | ICD-10-CM

## 2019-01-27 DIAGNOSIS — N6315 Unspecified lump in the right breast, overlapping quadrants: Secondary | ICD-10-CM

## 2019-01-27 NOTE — Telephone Encounter (Signed)
Appointment scheduled at breast center on 02/01/19 @ 2:30pm. Patient informed

## 2019-01-27 NOTE — Telephone Encounter (Signed)
-----   Message from [name], MD sent at 01/26/2019  1:03 PM EST ----- Regarding: Rt Dx mammo/US Rt breast mass at 11-12 O'clock 2 x 3 cm, mobile, increased in size x 11/2018.  2nd degree relatives with Breast Ca.

## 2019-02-01 ENCOUNTER — Ambulatory Visit
Admission: RE | Admit: 2019-02-01 | Discharge: 2019-02-01 | Disposition: A | Discharge status: Home / Self Care | Payer: 59 | Source: Ambulatory Visit | Attending: Obstetrics & Gynecology | Admitting: Obstetrics & Gynecology

## 2019-02-01 ENCOUNTER — Other Ambulatory Visit: Payer: Self-pay

## 2019-02-01 DIAGNOSIS — N6315 Unspecified lump in the right breast, overlapping quadrants: Secondary | ICD-10-CM

## 2019-02-01 DIAGNOSIS — N6310 Unspecified lump in the right breast, unspecified quadrant: Principal | ICD-10-CM

## 2019-03-08 ENCOUNTER — Other Ambulatory Visit: Payer: Self-pay | Admitting: Internal Medicine

## 2019-03-08 NOTE — Telephone Encounter (Signed)
Control database checked last refill: 02/10/2019 LOV: CPE 03/23/2018 [id number]

## 2019-03-13 ENCOUNTER — Other Ambulatory Visit: Payer: Self-pay

## 2019-03-15 ENCOUNTER — Ambulatory Visit: Payer: 59 | Admitting: Obstetrics & Gynecology

## 2019-04-05 ENCOUNTER — Encounter: Payer: 59 | Admitting: Obstetrics & Gynecology

## 2019-04-26 ENCOUNTER — Ambulatory Visit: Payer: 59 | Admitting: Obstetrics & Gynecology

## 2019-05-09 ENCOUNTER — Other Ambulatory Visit: Payer: Self-pay

## 2019-05-10 ENCOUNTER — Ambulatory Visit (INDEPENDENT_AMBULATORY_CARE_PROVIDER_SITE_OTHER): Payer: 59 | Admitting: Obstetrics & Gynecology

## 2019-05-10 ENCOUNTER — Encounter: Payer: Self-pay | Admitting: Obstetrics & Gynecology

## 2019-05-10 VITALS — BP 118/72

## 2019-05-10 DIAGNOSIS — Z30433 Encounter for removal and reinsertion of intrauterine contraceptive device: Secondary | ICD-10-CM | POA: Diagnosis not present

## 2019-05-10 NOTE — Patient Instructions (Signed)
1. Encounter for IUD removal and reinsertion Easily removal of Mirena IUD and insertion of a new one.  No complication.  Well-tolerated.  Precautions post insertion reviewed.  Patient will follow-up in 4 weeks for IUD check and annual gynecologic exam at the same visit.  Joanna Thompson, un plaisir de te voir aujourd'hui!

## 2019-05-10 NOTE — Progress Notes (Signed)
    Joanna Thompson 01-01-1976 [id number]        43 y.o.  G1P1L1 Married.  NP in Substance Abuse. Daughter Chrys Racer is a rising 6th grader.  RP: Remove/Insert Mirena IUD  HPI: Time to change the Mirena IUD after 5 yrs.  No pelvic pain.  Started back having light menses in the last few months.   OB History  Gravida Para Term Preterm AB Living  1 1       1   SAB TAB Ectopic Multiple Live Births               # Outcome Date GA Lbr Len/2nd Weight Sex Delivery Anes PTL Lv  1 Para             Past medical history,surgical history, problem list, medications, allergies, family history and social history were all reviewed and documented in the EPIC chart.   Directed ROS with pertinent positives and negatives documented in the history of present illness/assessment and plan.  Exam:  Vitals:   05/10/19 1605  BP: 118/72   General appearance:  Normal                                                                    IUD procedure note       Patient presented to the office today for removal and placement of Mirena IUD. The patient had previously been provided with literature information on this method of contraception. The risks benefits and pros and cons were discussed and all her questions were answered. She is fully aware that this form of contraception is 99% effective and is good for 5 years.  Pelvic exam: Vulva normal Vagina: No lesions or discharge Cervix: No lesions or discharge.  IUD string visible, grasped with a fenestrated clamp and IUD pulled out easily.  IUD complete, intact, shown to patient and discarded. Uterus: AV position Adnexa: No masses or tenderness Rectal exam: Not done  The cervix was cleansed with Betadine solution. Hurricane spray on the cervix.  A single-tooth tenaculum was placed on the anterior cervical lip. The IUD was shown to the patient and inserted in a sterile fashion.  Hysterometry with the IUD as being inserted was 7 cm.  The IUD string was trimmed.  The single-tooth tenaculum was removed. Patient was instructed to return back to the office in one month for follow up.        Assessment/Plan:  43 y.o. G1P1   1. Encounter for IUD removal and reinsertion Easily removal of Mirena IUD and insertion of a new one.  No complication.  Well-tolerated.  Precautions post insertion reviewed.  Patient will follow-up in 4 weeks for IUD check and annual gynecologic exam at the same visit.  [name] MD, 4:11 PM 05/10/2019

## 2019-05-19 ENCOUNTER — Encounter: Payer: 59 | Admitting: Obstetrics & Gynecology

## 2019-05-25 ENCOUNTER — Encounter: Payer: Self-pay | Admitting: Anesthesiology

## 2019-06-02 ENCOUNTER — Ambulatory Visit: Payer: 59 | Admitting: Obstetrics & Gynecology

## 2019-06-07 ENCOUNTER — Ambulatory Visit: Payer: 59 | Admitting: Obstetrics & Gynecology

## 2019-06-07 ENCOUNTER — Encounter: Payer: Self-pay | Admitting: Obstetrics & Gynecology

## 2019-06-07 ENCOUNTER — Other Ambulatory Visit: Payer: Self-pay

## 2019-06-07 VITALS — BP 120/78 | Ht 65.0 in | Wt 131.0 lb

## 2019-06-07 DIAGNOSIS — Z30431 Encounter for routine checking of intrauterine contraceptive device: Secondary | ICD-10-CM | POA: Diagnosis not present

## 2019-06-07 DIAGNOSIS — Z20828 Contact with and (suspected) exposure to other viral communicable diseases: Secondary | ICD-10-CM

## 2019-06-07 DIAGNOSIS — Z01419 Encounter for gynecological examination (general) (routine) without abnormal findings: Secondary | ICD-10-CM | POA: Diagnosis not present

## 2019-06-07 DIAGNOSIS — Z20822 Contact with and (suspected) exposure to covid-19: Secondary | ICD-10-CM

## 2019-06-07 NOTE — Patient Instructions (Signed)
1. Well female exam with routine gynecological exam Normal gynecologic exam.  Pap test March 2019 was negative with negative high-risk HPV, no indication to repeat a Pap test this year.  Breast exam status post bilateral augmentation normal.  Diagnostic mammography in March 2020 was negative, recommended screening mammogram in a year from then.  Health labs with family physician.  Body mass index 21.8.  Increase fitness activities.  2. Encounter for routine checking of intrauterine contraceptive device (IUD) Mirena IUD well-tolerated and in good position, inserted May 10, 2019.  3. Exposure to Covid-19 Virus Possible exposure to COVID-19 through coworker have her babysitter.  Patient is asymptomatic.  COVID-19 testing done today. - SAR CoV2 Serology (COVID 19)AB(IGG)IA  Mateo Flow, un plaisir de te voir aujourd'hui!  Je te transmets tes resultats aussitot que disponibles.

## 2019-06-07 NOTE — Progress Notes (Signed)
Joanna Thompson [birth year] [id number]   History:    43 y.o. G1P1L1 Married.  NP susbtance abuse.  Daughter [name], rising 6th grader.  RP: Established patient presenting for annual gyn exam   HPI: Well on Mirena IUD, new one inserted 05/10/2019.  No BTB.  No pelvic pain.  No pain with IC.  Urine/BMs normal.  Breasts normal, s/p bilateral augmentation.  BMI 21.80.  Walking.  Health labs with Fam MD. Possible exposure to COVID-19 through a coworker of her babysitter.  Past medical history,surgical history, family history and social history were all reviewed and documented in the EPIC chart.  Gynecologic History No LMP recorded. (Menstrual status: IUD). Contraception: Mirena IUD x 04/2019 Last Pap: 01/2018. Results were: Negative/HPV HR neg Last mammogram: 01/2019. Results were: Negative Bone Density: Never Colonoscopy: Never  Obstetric History OB History  Gravida Para Term Preterm AB Living  1 1       1   SAB TAB Ectopic Multiple Live Births               # Outcome Date GA Lbr Len/2nd Weight Sex Delivery Anes PTL Lv  1 Para              ROS: A ROS was performed and pertinent positives and negatives are included in the history.  GENERAL: No fevers or chills. HEENT: No change in vision, no earache, sore throat or sinus congestion. NECK: No pain or stiffness. CARDIOVASCULAR: No chest pain or pressure. No palpitations. PULMONARY: No shortness of breath, cough or wheeze. GASTROINTESTINAL: No abdominal pain, nausea, vomiting or diarrhea, melena or bright red blood per rectum. GENITOURINARY: No urinary frequency, urgency, hesitancy or dysuria. MUSCULOSKELETAL: No joint or muscle pain, no back pain, no recent trauma. DERMATOLOGIC: No rash, no itching, no lesions. ENDOCRINE: No polyuria, polydipsia, no heat or cold intolerance. No recent change in weight. HEMATOLOGICAL: No anemia or easy bruising or bleeding. NEUROLOGIC: No headache, seizures, numbness, tingling or weakness. PSYCHIATRIC: No  depression, no loss of interest in normal activity or change in sleep pattern.     Exam:   BP 120/78   Ht 5\' 5"  (1.651 m)   Wt 131 lb (59.4 kg)   BMI 21.80 kg/m   Body mass index is 21.8 kg/m.  General appearance : Well developed well nourished female. No acute distress HEENT: Eyes: no retinal hemorrhage or exudates,  Neck supple, trachea midline, no carotid bruits, no thyroidmegaly Lungs: Clear to auscultation, no rhonchi or wheezes, or rib retractions  Heart: Regular rate and rhythm, no murmurs or gallops Breast:Examined in sitting and supine position were symmetrical in appearance, no palpable masses or tenderness,  no skin retraction, no nipple inversion, no nipple discharge, no skin discoloration, no axillary or supraclavicular lymphadenopathy Abdomen: no palpable masses or tenderness, no rebound or guarding Extremities: no edema or skin discoloration or tenderness  Pelvic: Vulva: Normal             Vagina: No gross lesions or discharge  Cervix: No gross lesions or discharge.  IUD strings felt at [facility].  Uterus  AV, normal size, shape and consistency, non-tender and mobile  Adnexa  Without masses or tenderness  Anus: Normal   Assessment/Plan:  43 y.o. female for annual exam   1. Well female exam with routine gynecological exam Normal gynecologic exam.  Pap test March 2019 was negative with negative high-risk HPV, no indication to repeat a Pap test this year.  Breast exam status post bilateral augmentation normal.  Diagnostic  mammography in March 2020 was negative, recommended screening mammogram in a year from then.  Health labs with family physician.  Body mass index 21.8.  Increase fitness activities.  2. Encounter for routine checking of intrauterine contraceptive device (IUD) Mirena IUD well-tolerated and in good position, inserted May 10, 2019.  3. Exposure to Covid-19 Virus Possible exposure to COVID-19 through coworker have her babysitter.  Patient is asymptomatic.   COVID-19 testing done today. - SAR CoV2 Serology (COVID 19)AB(IGG)IA   [name] MD, 4:37 PM 06/07/2019

## 2019-06-09 LAB — SAR COV2 SEROLOGY (COVID19)AB(IGG),IA: SARS CoV2 AB IGG: NEGATIVE

## 2019-07-18 IMAGING — MG DIGITAL DIAGNOSTIC BILATERAL MAMMOGRAM WITH IMPLANTS, CAD AND TO
6 of 9 series · 6 of 25 positions shown · non-contrast
Comparison: Previous exam(s).

CLINICAL DATA: 42-year-old female with a palpable right breast lump
for 4 months and associated pain/pressure.

EXAM:
DIGITAL DIAGNOSTIC BILATERAL MAMMOGRAM WITH IMPLANTS, CAD AND TOMO
ULTRASOUND RIGHT BREAST
The patient has retropectoral implants. Standard and implant
displaced views were performed.

[R CC]
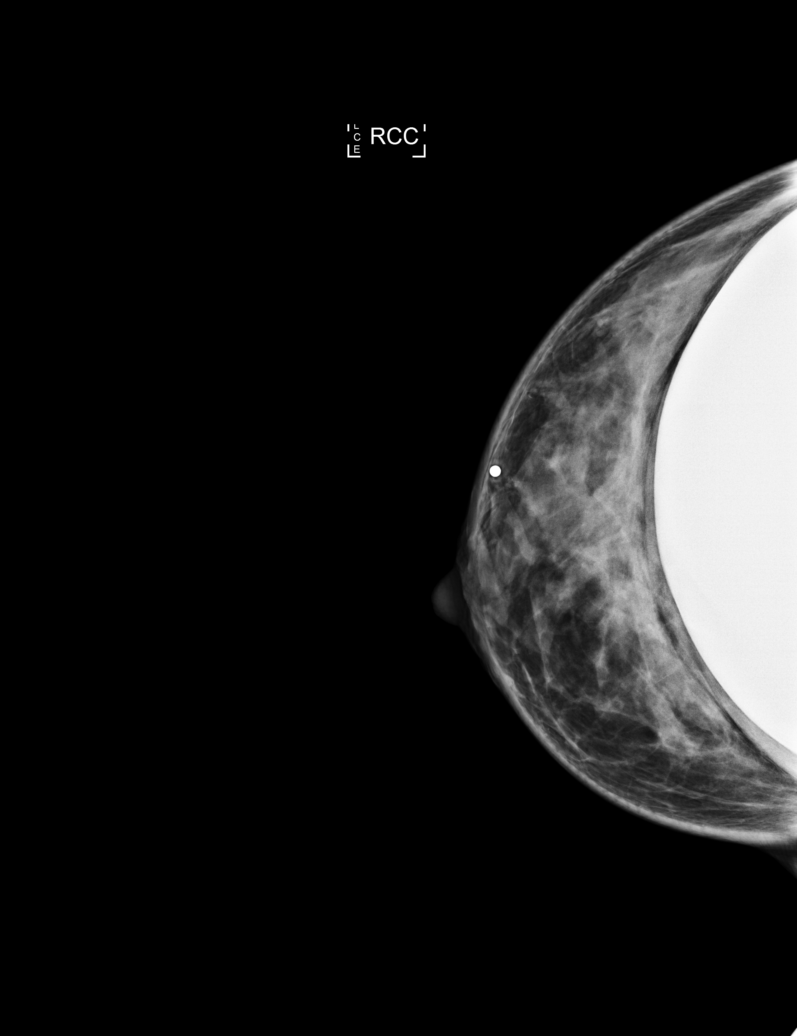

[L MLO]
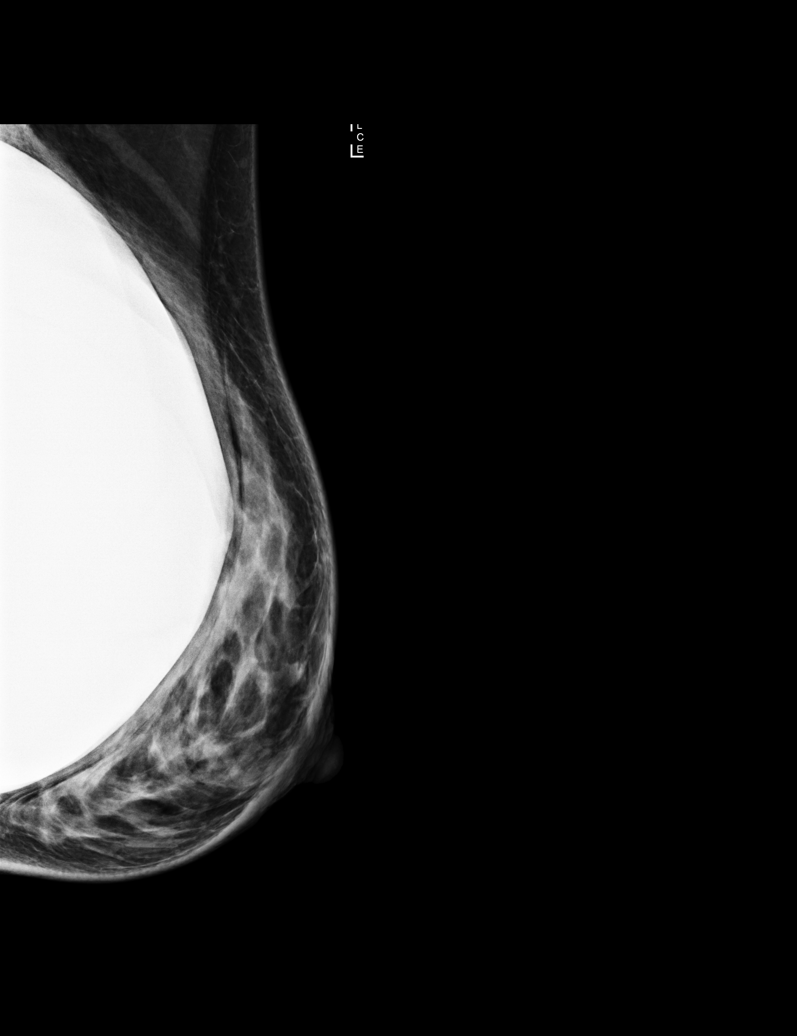

[R CC synth-2D]
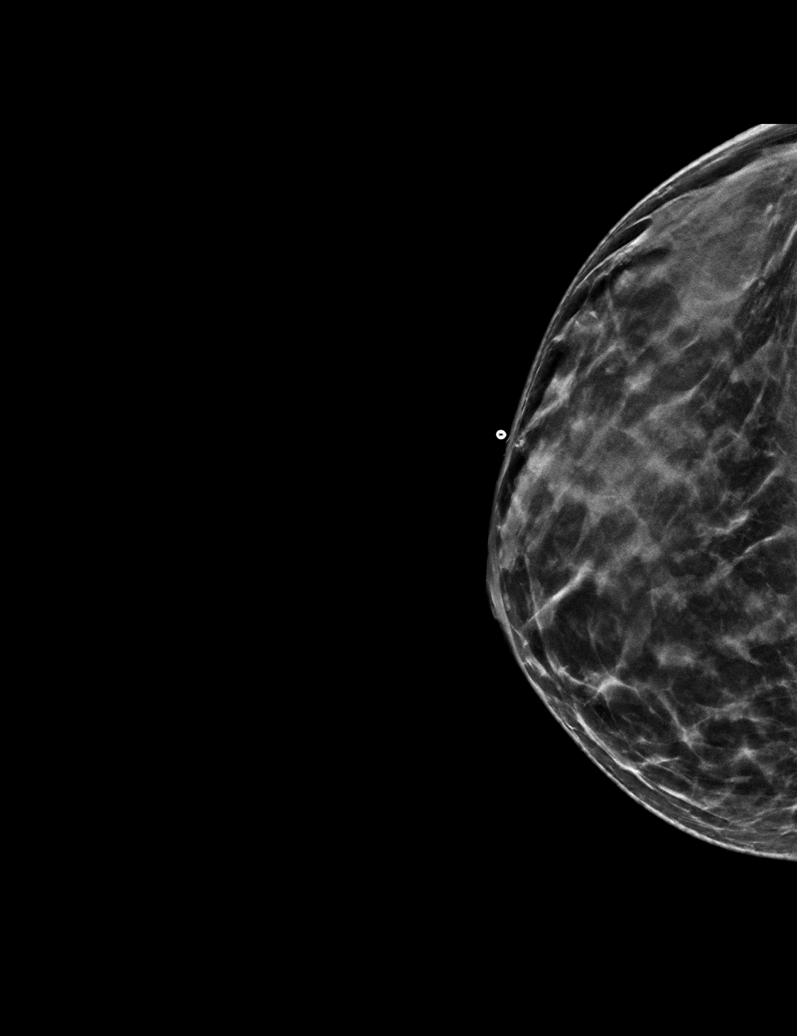

[R MLO synth-2D]
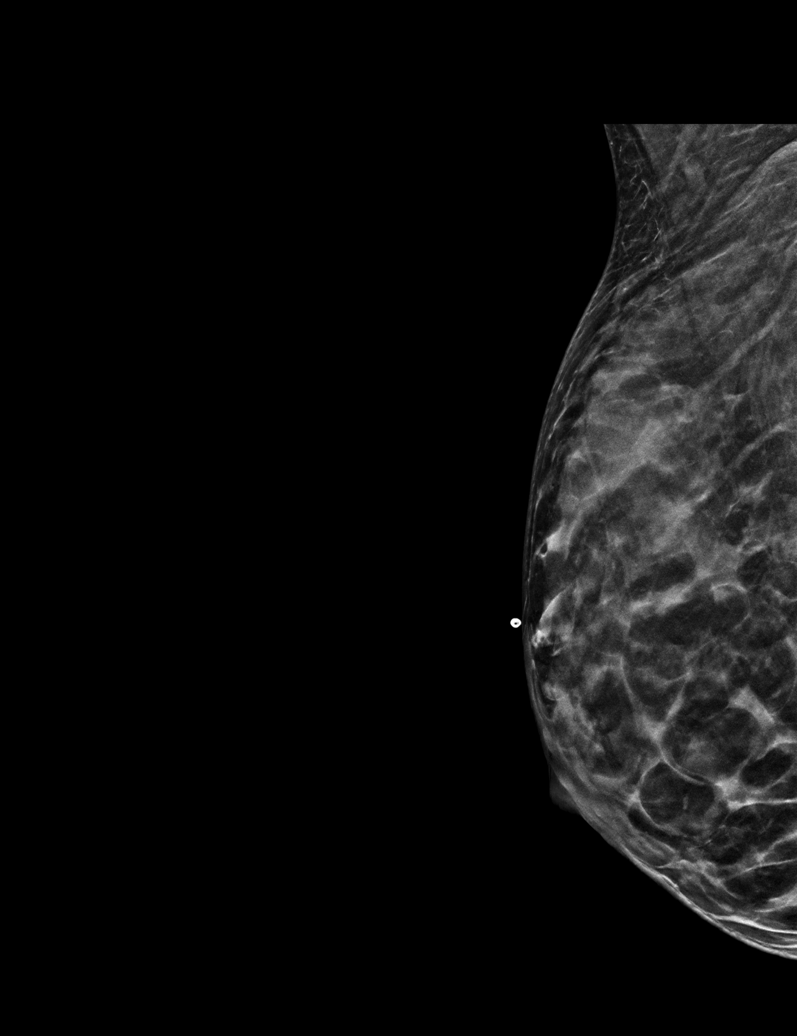

[L CC synth-2D]
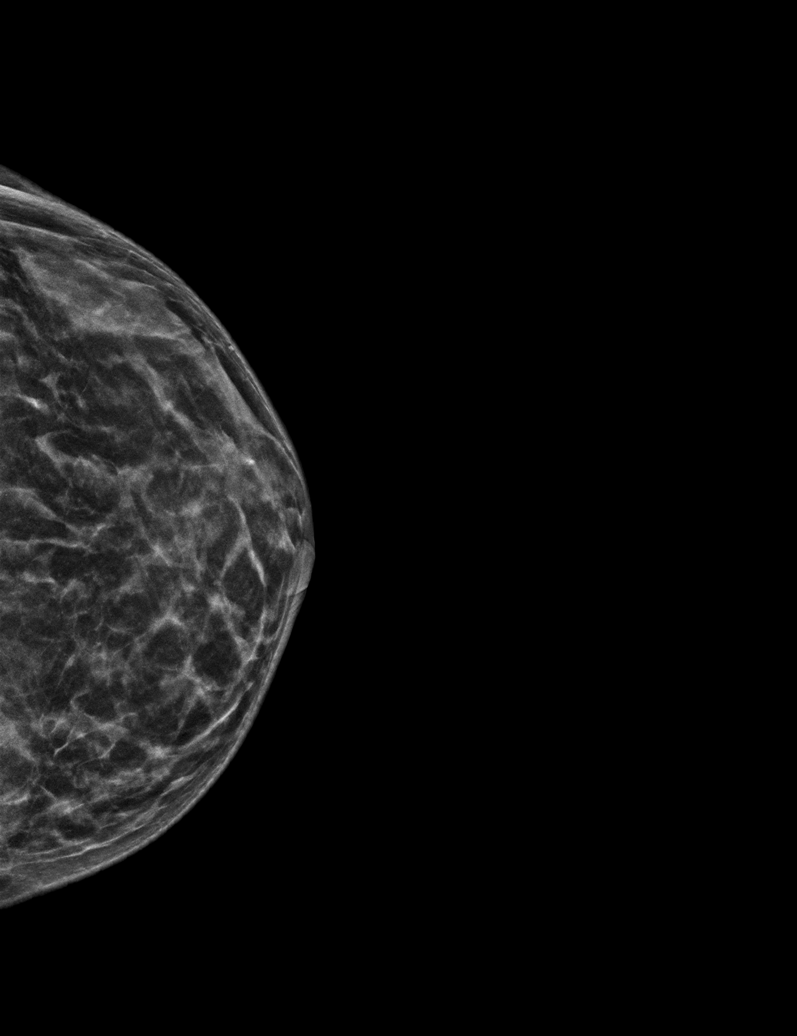

[R TAN tomo · tomo slice 23/46.0]
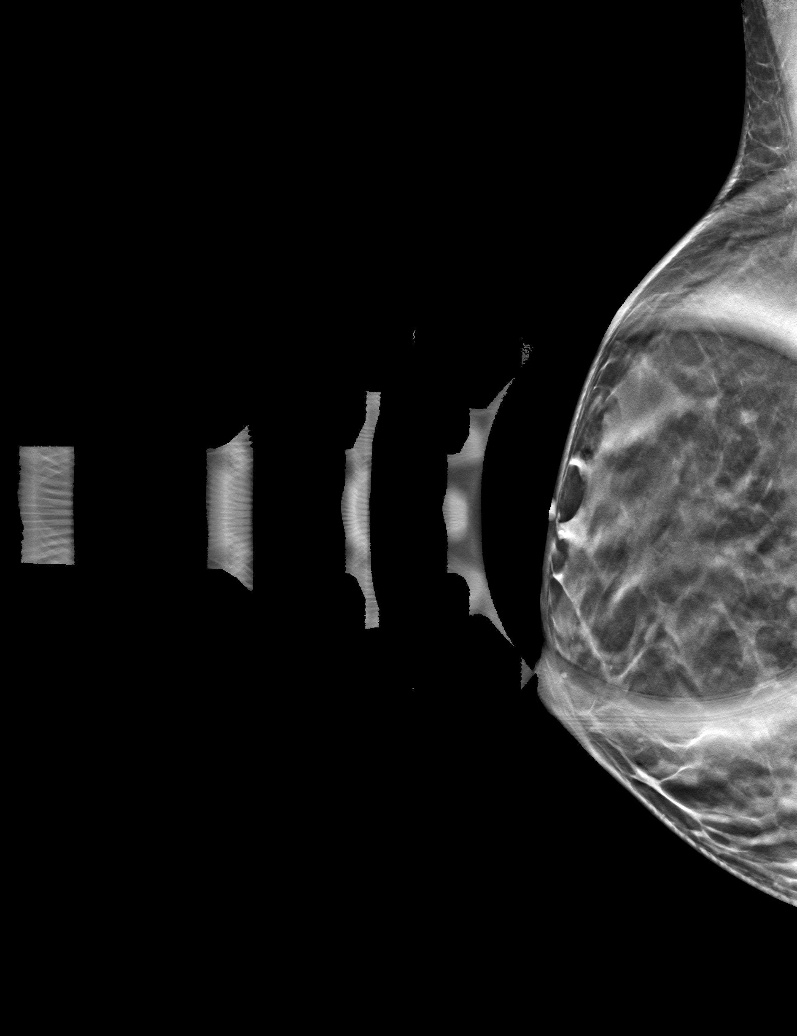

[6 of 25 positions shown; findings below may reference images not displayed]

ACR Breast Density Category c: The breast tissue is heterogeneously
dense, which may obscure small masses.
FINDINGS: A radiopaque BB was placed at the site of the patient's palpable
lump in the upper-outer right breast. No focal findings are seen
deep to the radiopaque BB or within the remainder of either breast.

On physical exam, I palpate dense fibroglandular tissue without
suspicious lump in the upper-outer right breast.

Targeted ultrasound is performed, showing dense fibroglandular
tissue without focal or suspicious sonographic abnormality.
Evaluation of the upper outer right breast was performed.

Mammographic images were processed with CAD.
IMPRESSION: 1. No mammographic evidence of malignancy in either breast.
2. No suspicious sonographic findings at the site of the patient's
right breast palpable lump.

RECOMMENDATION:
1. Clinical follow-up recommended for the painful/palpable area of
concern in the right breast. Any further workup should be based on
clinical grounds.
2.  Screening mammogram in one year.(Code:AD-H-RQM)

I have discussed the findings and recommendations with the patient.
Results were also provided in writing at the conclusion of the
visit. If applicable, a reminder letter will be sent to the patient
regarding the next appointment.

BI-RADS CATEGORY  1: Negative.

## 2019-08-11 ENCOUNTER — Other Ambulatory Visit: Payer: Self-pay | Admitting: Internal Medicine

## 2019-08-11 NOTE — Telephone Encounter (Signed)
Patient needs appointment has not been seen in a year

## 2019-09-14 ENCOUNTER — Encounter: Payer: Self-pay | Admitting: Internal Medicine

## 2019-09-14 ENCOUNTER — Ambulatory Visit (INDEPENDENT_AMBULATORY_CARE_PROVIDER_SITE_OTHER): Payer: Managed Care, Other (non HMO) | Admitting: Internal Medicine

## 2019-09-14 ENCOUNTER — Other Ambulatory Visit: Payer: Self-pay

## 2019-09-14 ENCOUNTER — Other Ambulatory Visit (INDEPENDENT_AMBULATORY_CARE_PROVIDER_SITE_OTHER): Payer: Managed Care, Other (non HMO)

## 2019-09-14 VITALS — BP 112/70 | HR 96 | Temp 98.2°F | Ht 65.0 in | Wt 130.0 lb

## 2019-09-14 DIAGNOSIS — R21 Rash and other nonspecific skin eruption: Secondary | ICD-10-CM | POA: Insufficient documentation

## 2019-09-14 DIAGNOSIS — Z23 Encounter for immunization: Secondary | ICD-10-CM

## 2019-09-14 DIAGNOSIS — Z0000 Encounter for general adult medical examination without abnormal findings: Secondary | ICD-10-CM

## 2019-09-14 LAB — CBC
HCT: 41.1 % (ref 36.0–46.0)
Hemoglobin: 13.9 g/dL (ref 12.0–15.0)
MCHC: 33.9 g/dL (ref 30.0–36.0)
MCV: 90.8 fl (ref 78.0–100.0)
Platelets: 210 10*3/uL (ref 150.0–400.0)
RBC: 4.53 Mil/uL (ref 3.87–5.11)
RDW: 13.2 % (ref 11.5–15.5)
WBC: 6.1 10*3/uL (ref 4.0–10.5)

## 2019-09-14 LAB — COMPREHENSIVE METABOLIC PANEL
ALT: 15 U/L (ref 0–35)
AST: 17 U/L (ref 0–37)
Albumin: 4.4 g/dL (ref 3.5–5.2)
Alkaline Phosphatase: 40 U/L (ref 39–117)
BUN: 20 mg/dL (ref 6–23)
CO2: 30 mEq/L (ref 19–32)
Calcium: 9.3 mg/dL (ref 8.4–10.5)
Chloride: 101 mEq/L (ref 96–112)
Creatinine, Ser: 1.05 mg/dL (ref 0.40–1.20)
GFR: 57.31 mL/min — ABNORMAL LOW (ref >60.00)
Glucose, Bld: 92 mg/dL (ref 70–99)
Potassium: 3.9 mEq/L (ref 3.5–5.1)
Sodium: 137 mEq/L (ref 135–145)
Total Bilirubin: 0.3 mg/dL (ref 0.2–1.2)
Total Protein: 6.7 g/dL (ref 6.0–8.3)

## 2019-09-14 LAB — LIPID PANEL
Cholesterol: 184 mg/dL (ref 0–200)
HDL: 71.7 mg/dL (ref >39.00)
LDL Cholesterol: 76 mg/dL (ref 0–99)
NonHDL: 112.69
Total CHOL/HDL Ratio: 3
Triglycerides: 184 mg/dL — ABNORMAL HIGH (ref 0.0–149.0)
VLDL: 36.8 mg/dL (ref 0.0–40.0)

## 2019-09-14 LAB — HEMOGLOBIN A1C: Hgb A1c MFr Bld: 5.1 % (ref 4.6–6.5)

## 2019-09-14 MED ORDER — METHYLPREDNISOLONE ACETATE 40 MG/ML IJ SUSP
40.0000 mg | Freq: Once | INTRAMUSCULAR | Status: AC
  Administered 2019-09-14: 40 mg via INTRAMUSCULAR

## 2019-09-14 NOTE — Progress Notes (Signed)
   Subjective:   Patient ID: Joanna Thompson, female    DOB: 1976-07-24, 43 y.o.   MRN: [id number]  HPI The patient is a 43 YO female coming in for concerns about rash on her arms. They are on the hands, arms, and legs. She noticed them about 1-2 days ago. [place] and does not recall any exposure with plants. Does not feel confident to identify poison oak/ivy. Denies fevers or chills. Denies cough or SOB. Denies diarrhea or constipation. Not on palms hands or feet. On the arms and legs mostly. Overall not improving. Trying not to scratch without a lot of success. Took benadryl but this knocked her out and she could not take again. Taking zyrtec for allergies daily.   Review of Systems  Constitutional: Negative.   HENT: Negative.   Eyes: Negative.   Respiratory: Negative for cough, chest tightness and shortness of breath.   Cardiovascular: Negative for chest pain, palpitations and leg swelling.  Gastrointestinal: Negative for abdominal distention, abdominal pain, constipation, diarrhea, nausea and vomiting.  Musculoskeletal: Negative.   Skin: Positive for rash.  Neurological: Negative.   Psychiatric/Behavioral: Negative.     Objective:  Physical Exam Constitutional:      Appearance: She is well-developed.  HENT:     Head: Normocephalic and atraumatic.  Neck:     Musculoskeletal: Normal range of motion.  Cardiovascular:     Rate and Rhythm: Normal rate and regular rhythm.  Pulmonary:     Effort: Pulmonary effort is normal. No respiratory distress.     Breath sounds: Normal breath sounds. No wheezing or rales.  Abdominal:     General: Bowel sounds are normal. There is no distension.     Palpations: Abdomen is soft.     Tenderness: There is no abdominal tenderness. There is no rebound.  Skin:    General: Skin is warm and dry.     Findings: Rash present.     Comments: Rash on the left and right arm, also right leg  Neurological:     Mental Status: She  is alert and oriented to person, place, and time.     Coordination: Coordination normal.     Vitals:   09/14/19 1502  BP: 112/70  Pulse: 96  Temp: 98.2 F (36.8 C)  TempSrc: Oral  SpO2: 97%  Weight: 130 lb (59 kg)  Height: 5\' 5"  (1.651 m)    Assessment & Plan:  Depo-medrol 40 mg IM given, tdap given at visit

## 2019-09-14 NOTE — Assessment & Plan Note (Signed)
Given depo-medrol 40 mg IM. Advised to take zyrtec TID for itching, avoid scratching, avoid hot shower/bath.

## 2019-09-14 NOTE — Patient Instructions (Signed)
We have given you the shot today which should help. You can take zyrtec three times a day for itching if needed.

## 2019-09-15 ENCOUNTER — Encounter: Payer: Self-pay | Admitting: Internal Medicine

## 2019-09-15 MED ORDER — PREDNISONE 20 MG PO TABS: 40.0000 mg | ORAL_TABLET | Freq: Every day | ORAL | 0 refills | Status: DC

## 2020-02-08 ENCOUNTER — Other Ambulatory Visit: Payer: Self-pay

## 2020-02-09 ENCOUNTER — Telehealth: Payer: Self-pay

## 2020-02-09 ENCOUNTER — Other Ambulatory Visit: Payer: Self-pay

## 2020-02-09 ENCOUNTER — Ambulatory Visit: Payer: Managed Care, Other (non HMO) | Admitting: Obstetrics & Gynecology

## 2020-02-09 ENCOUNTER — Encounter: Payer: Self-pay | Admitting: Obstetrics & Gynecology

## 2020-02-09 VITALS — BP 118/76

## 2020-02-09 DIAGNOSIS — N393 Stress incontinence (female) (male): Secondary | ICD-10-CM | POA: Diagnosis not present

## 2020-02-09 DIAGNOSIS — N921 Excessive and frequent menstruation with irregular cycle: Secondary | ICD-10-CM

## 2020-02-09 DIAGNOSIS — R5383 Other fatigue: Secondary | ICD-10-CM

## 2020-02-09 DIAGNOSIS — Z975 Presence of (intrauterine) contraceptive device: Secondary | ICD-10-CM

## 2020-02-09 LAB — CBC
HCT: 41.7 % (ref 35.0–45.0)
Hemoglobin: 13.9 g/dL (ref 11.7–15.5)
MCH: 30.3 pg (ref 27.0–33.0)
MCHC: 33.3 g/dL (ref 32.0–36.0)
MCV: 90.8 fL (ref 80.0–100.0)
MPV: 10.6 fL (ref 7.5–12.5)
Platelets: 243 10*3/uL (ref 140–400)
RBC: 4.59 10*6/uL (ref 3.80–5.10)
RDW: 12 % (ref 11.0–15.0)
WBC: 4.4 10*3/uL (ref 3.8–10.8)

## 2020-02-09 LAB — VITAMIN D 25 HYDROXY (VIT D DEFICIENCY, FRACTURES): Vit D, 25-Hydroxy: 33 ng/mL (ref 30–100)

## 2020-02-09 LAB — PROLACTIN: Prolactin: 17.3 ng/mL

## 2020-02-09 LAB — TSH: TSH: 2.26 mIU/L

## 2020-02-09 MED ORDER — NORETHINDRONE 0.35 MG PO TABS: 1.0000 | ORAL_TABLET | Freq: Every day | ORAL | 4 refills | Status: DC

## 2020-02-09 MED ORDER — NORETHINDRONE ACET-ETHINYL EST 1-20 MG-MCG PO TABS: 1.0000 | ORAL_TABLET | Freq: Every day | ORAL | 4 refills | Status: DC

## 2020-02-09 NOTE — Progress Notes (Signed)
Joanna Thompson [birth year] [id number]        44 y.o.  G1P1L1 Married.  NP in Substance Abuse.  Coco doing well.  RP: BTB and cramps on Mirena IUD  HPI: New Mirena IUD x 04/2019.  With the previous Mirena IUD, patient had no vaginal bleeding at all.  Since the new Mirena IUD was inserted, patient has experienced irregular frequent light bleeding.  Last Monday, had a severe pelvic cramp associated with passage of blood clots.  Since then, the bleeding has decreased to spotting.  No pelvic pain when not bleeding.  No abnormal vaginal discharge.  Increased fatigue in general with a lot of stress at work.  More fatigue since patient received the Covid vaccine 2 weeks ago.  Low energy and less joy recently, but no frank symptoms of major depression.  Complains of stress urinary incontinence, mainly when running.   OB History  Gravida Para Term Preterm AB Living  1 1       1   SAB TAB Ectopic Multiple Live Births               # Outcome Date GA Lbr Len/2nd Weight Sex Delivery Anes PTL Lv  1 Para             Past medical history,surgical history, problem list, medications, allergies, family history and social history were all reviewed and documented in the EPIC chart.   Directed ROS with pertinent positives and negatives documented in the history of present illness/assessment and plan.  Exam:  Vitals:   02/09/20 0810  BP: 118/76   General appearance:  Normal  Abdomen: Normal  Gynecologic exam: Vulva normal.  Speculum: Cervix normal with IUD strings visible at the external os.  No polyp seen at the external os.  Mild dark blood at that level.  Vagina normal.  Bimanual exam: Uterus anteverted, normal volume, mobile, nontender.  No adnexal mass felt, nontender bilaterally.   Assessment/Plan:  44 y.o. G1P1   1. Breakthrough bleeding with IUD Frequent breakthrough bleeding on Mirena IUD with one episode of severe cramping and passage of blood clots.  Pelvic exam today was normal.  Mirena  IUD was in good position with the strings visible at the external os.  We will complete the investigation with lab work to rule out thyroid dysfunction hyperprolactinemia low vitamin D and anemia.  We will follow-up with a pelvic ultrasound to rule out endometrial pathology such as polyps, submucosal fibroids, endometrial hyperplasia and endometrial cancer.  Decision to add a low dosage birth control pill to control the breakthrough bleeding, cramping and hopefully mood issues.  We will try for at least 1 pack.  No contraindication to birth control pills.  Generic of Loestrin 1/20 sent to pharmacy.  - TSH - Prolactin - VITAMIN D 25 Hydroxy (Vit-D Deficiency, Fractures) - CBC - US Transvaginal Non-OB; Future  2. Fatigue, unspecified type Probable fatigue due to high level of stress associated with work.  Possible side effect of the Covid vaccine received recently.  Will rule out thyroid dysfunction and low vitamin D level as well as anemia.  Lab work done.  Management per results.  3. SUI (stress urinary incontinence, female) Mild stress urinary incontinence especially with running.  Kegel exercises reinforced.  Physical therapy would be the next step.  Surgical approach not recommended at this time.  Other orders - norethindrone-ethinyl estradiol (LOESTRIN) 1-20 MG-MCG tablet; Take 1 tablet by mouth daily.  [name] MD, 8:51 AM 02/09/2020

## 2020-02-09 NOTE — Telephone Encounter (Signed)
Staff message from Dr. Marguerita Merles "I sent for 3 packs, refill x 4. Please let patient know that she can buy only one pack for now and decide after the pack if she wants to continue or not. "  I advised patient as well as left message with pharmacy that fine for patient to get just one pack if she desires.  I left message for pharmacy to cancel the progestin only pill Dr. Marguerita Merles prescribed in error and to be sure what they fill for patient is the generic Loestrin 1/20. My direct phone number left in case they have any questions.

## 2020-02-10 ENCOUNTER — Encounter: Payer: Self-pay | Admitting: Obstetrics & Gynecology

## 2020-02-10 NOTE — Patient Instructions (Signed)
1. Breakthrough bleeding with IUD Frequent breakthrough bleeding on Mirena IUD with one episode of severe cramping and passage of blood clots.  Pelvic exam today was normal.  Mirena IUD was in good position with the strings visible at the external os.  We will complete the investigation with lab work to rule out thyroid dysfunction hyperprolactinemia low vitamin D and anemia.  We will follow-up with a pelvic ultrasound to rule out endometrial pathology such as polyps, submucosal fibroids, endometrial hyperplasia and endometrial cancer.  Decision to add a low dosage birth control pill to control the breakthrough bleeding, cramping and hopefully mood issues.  We will try for at least 1 pack.  No contraindication to birth control pills.  Generic of Loestrin 1/20 sent to pharmacy.  - TSH - Prolactin - VITAMIN D 25 Hydroxy (Vit-D Deficiency, Fractures) - CBC - US Transvaginal Non-OB; Future  2. Fatigue, unspecified type Probable fatigue due to high level of stress associated with work.  Possible side effect of the Covid vaccine received recently.  Will rule out thyroid dysfunction and low vitamin D level as well as anemia.  Lab work done.  Management per results.  3. SUI (stress urinary incontinence, female) Mild stress urinary incontinence especially with running.  Kegel exercises reinforced.  Physical therapy would be the next step.  Surgical approach not recommended at this time.  Other orders - norethindrone-ethinyl estradiol (LOESTRIN) 1-20 MG-MCG tablet; Take 1 tablet by mouth daily.  Joanna Thompson, it was a pleasure seeing you today!  I will inform you of your results as soon as they are available.

## 2020-02-22 ENCOUNTER — Ambulatory Visit (INDEPENDENT_AMBULATORY_CARE_PROVIDER_SITE_OTHER): Payer: Managed Care, Other (non HMO)

## 2020-02-22 ENCOUNTER — Ambulatory Visit: Payer: Managed Care, Other (non HMO) | Admitting: Obstetrics & Gynecology

## 2020-02-22 ENCOUNTER — Other Ambulatory Visit: Payer: Self-pay

## 2020-02-22 ENCOUNTER — Encounter: Payer: Self-pay | Admitting: Obstetrics & Gynecology

## 2020-02-22 DIAGNOSIS — Z975 Presence of (intrauterine) contraceptive device: Secondary | ICD-10-CM

## 2020-02-22 DIAGNOSIS — N921 Excessive and frequent menstruation with irregular cycle: Secondary | ICD-10-CM

## 2020-02-22 NOTE — Progress Notes (Signed)
    Joanna Thompson February 22, 1976 [id number]        44 y.o.  G1P1L1 Married  RP: BTB on Mirena IUD for Pelvic US  HPI: No current bleeding.  On 02/09/2020 we noted:  New Mirena IUD x 04/2019.  With the previous Mirena IUD, patient had no vaginal bleeding at all.  Since the new Mirena IUD was inserted, patient has experienced irregular frequent light bleeding.  Last Monday, had a severe pelvic cramp associated with passage of blood clots.  Since then, the bleeding has decreased to spotting.  No pelvic pain when not bleeding.  No abnormal vaginal discharge.   OB History  Gravida Para Term Preterm AB Living  1 1       1   SAB TAB Ectopic Multiple Live Births               # Outcome Date GA Lbr Len/2nd Weight Sex Delivery Anes PTL Lv  1 Para             Past medical history,surgical history, problem list, medications, allergies, family history and social history were all reviewed and documented in the EPIC chart.   Directed ROS with pertinent positives and negatives documented in the history of present illness/assessment and plan.  Exam:  There were no vitals filed for this visit. General appearance:  Normal  Pelvic US today: Comparison is made with previous scan March 2019.  T/V images.  Anteverted uterus normal in size and shape measured at 8.1 x 4.77 x 4.06 cm.  2 small fibroids are unchanged since previous scan: 1.5 cm intramural, anterior to endometrial cavity and 1.0 cm subserous anteriorly to the right side.  Thin and symmetrical endometrial lining measured at 2.78 mm.  No mass or thickening or abnormal blood flow seen adjacent to the IUD.  IUD is noted in proper position within the cavity.  Both ovaries are normal in size with normal follicular pattern.  A 1.8 cm resolving corpus luteum cyst is present on the right ovary.  No adnexal mass seen.  No free fluid in the posterior to the sac.   Assessment/Plan:  44 y.o. G1P1   1. Breakthrough bleeding with IUD No current bleeding with  Lenda Kelp IUD.  Pelvic ultrasound findings reviewed thoroughly with patient.  Stable very small intramural and subserous fibroids.  The endometrial lining is thin and normal at 2.78 mm with no mass or thickening or abnormal flow seen.  The Mirena IUD is in normal intra uterine position.  Both ovaries are normal.  Patient reassured.  We will follow-up for annual gynecologic exam next year.  [name] MD, 11:38 AM 02/22/2020

## 2020-02-22 NOTE — Patient Instructions (Signed)
1. Breakthrough bleeding with IUD No current bleeding with Joanna Thompson IUD.  Pelvic ultrasound findings reviewed thoroughly with patient.  Stable very small intramural and subserous fibroids.  The endometrial lining is thin and normal at 2.78 mm with no mass or thickening or abnormal Thompson seen.  The Mirena IUD is in normal intra uterine position.  Both ovaries are normal.  Patient reassured.  We will follow-up for annual gynecologic exam next year.  Joanna Thompson, un plaisir de te voir aujourd'hui!

## 2020-05-16 ENCOUNTER — Encounter: Payer: 59 | Admitting: Obstetrics & Gynecology

## 2020-10-21 ENCOUNTER — Other Ambulatory Visit: Payer: Self-pay | Admitting: Obstetrics & Gynecology

## 2020-10-21 DIAGNOSIS — Z1231 Encounter for screening mammogram for malignant neoplasm of breast: Secondary | ICD-10-CM

## 2020-10-25 DIAGNOSIS — Z1231 Encounter for screening mammogram for malignant neoplasm of breast: Secondary | ICD-10-CM

## 2020-11-29 ENCOUNTER — Encounter: Payer: Managed Care, Other (non HMO) | Admitting: Internal Medicine

## 2020-12-06 ENCOUNTER — Encounter: Payer: Self-pay | Admitting: Internal Medicine

## 2020-12-06 ENCOUNTER — Other Ambulatory Visit: Payer: Self-pay

## 2020-12-06 ENCOUNTER — Ambulatory Visit (INDEPENDENT_AMBULATORY_CARE_PROVIDER_SITE_OTHER): Payer: Managed Care, Other (non HMO) | Admitting: Internal Medicine

## 2020-12-06 VITALS — BP 106/78 | HR 77 | Temp 98.3°F | Ht 65.0 in | Wt 132.8 lb

## 2020-12-06 DIAGNOSIS — F909 Attention-deficit hyperactivity disorder, unspecified type: Secondary | ICD-10-CM | POA: Diagnosis not present

## 2020-12-06 DIAGNOSIS — Z0000 Encounter for general adult medical examination without abnormal findings: Secondary | ICD-10-CM

## 2020-12-06 DIAGNOSIS — E063 Autoimmune thyroiditis: Secondary | ICD-10-CM

## 2020-12-06 LAB — COMPREHENSIVE METABOLIC PANEL
ALT: 17 U/L (ref 0–35)
AST: 19 U/L (ref 0–37)
Albumin: 4.6 g/dL (ref 3.5–5.2)
Alkaline Phosphatase: 36 U/L — ABNORMAL LOW (ref 39–117)
BUN: 18 mg/dL (ref 6–23)
CO2: 29 mEq/L (ref 19–32)
Calcium: 9.7 mg/dL (ref 8.4–10.5)
Chloride: 102 mEq/L (ref 96–112)
Creatinine, Ser: 0.96 mg/dL (ref 0.40–1.20)
GFR: 72.3 mL/min (ref >60.00)
Glucose, Bld: 101 mg/dL — ABNORMAL HIGH (ref 70–99)
Potassium: 3.9 mEq/L (ref 3.5–5.1)
Sodium: 137 mEq/L (ref 135–145)
Total Bilirubin: 0.8 mg/dL (ref 0.2–1.2)
Total Protein: 6.9 g/dL (ref 6.0–8.3)

## 2020-12-06 LAB — CBC
HCT: 42.8 % (ref 36.0–46.0)
Hemoglobin: 14.5 g/dL (ref 12.0–15.0)
MCHC: 33.8 g/dL (ref 30.0–36.0)
MCV: 91.5 fl (ref 78.0–100.0)
Platelets: 201 10*3/uL (ref 150.0–400.0)
RBC: 4.67 Mil/uL (ref 3.87–5.11)
RDW: 13 % (ref 11.5–15.5)
WBC: 5.6 10*3/uL (ref 4.0–10.5)

## 2020-12-06 LAB — LIPID PANEL
Cholesterol: 201 mg/dL — ABNORMAL HIGH (ref 0–200)
HDL: 85.9 mg/dL (ref >39.00)
LDL Cholesterol: 104 mg/dL — ABNORMAL HIGH (ref 0–99)
NonHDL: 115.52
Total CHOL/HDL Ratio: 2
Triglycerides: 57 mg/dL (ref 0.0–149.0)
VLDL: 11.4 mg/dL (ref 0.0–40.0)

## 2020-12-06 LAB — T4, FREE: Free T4: 1.08 ng/dL (ref 0.60–1.60)

## 2020-12-06 LAB — TSH: TSH: 1.03 u[IU]/mL (ref 0.35–4.50)

## 2020-12-06 NOTE — Assessment & Plan Note (Signed)
Taking synthroid 125 mcg daily. Checking TSH and free T4.

## 2020-12-06 NOTE — Assessment & Plan Note (Signed)
Flu shot up to date. Covid-19 up to date including booster. Tetanus up to date. Colonoscopy up to date. Mammogram up to date, pap smear up to date. Counseled about sun safety and mole surveillance. Counseled about the dangers of distracted driving. Given 10 year screening recommendations.

## 2020-12-06 NOTE — Patient Instructions (Addendum)
We will check the food allergy.   Health Maintenance, Female Adopting a healthy lifestyle and getting preventive care are important in promoting health and wellness. Ask your health care provider about:  The right schedule for you to have regular tests and exams.  Things you can do on your own to prevent diseases and keep yourself healthy. What should I know about diet, weight, and exercise? Eat a healthy diet  Eat a diet that includes plenty of vegetables, fruits, low-fat dairy products, and lean protein.  Do not eat a lot of foods that are high in solid fats, added sugars, or sodium.   Maintain a healthy weight Body mass index (BMI) is used to identify weight problems. It estimates body fat based on height and weight. Your health care provider can help determine your BMI and help you achieve or maintain a healthy weight. Get regular exercise Get regular exercise. This is one of the most important things you can do for your health. Most adults should:  Exercise for at least 150 minutes each week. The exercise should increase your heart rate and make you sweat (moderate-intensity exercise).  Do strengthening exercises at least twice a week. This is in addition to the moderate-intensity exercise.  Spend less time sitting. Even light physical activity can be beneficial. Watch cholesterol and blood lipids Have your blood tested for lipids and cholesterol at 44 years of age, then have this test every 5 years. Have your cholesterol levels checked more often if:  Your lipid or cholesterol levels are high.  You are older than 44 years of age.  You are at high risk for heart disease. What should I know about cancer screening? Depending on your health history and family history, you may need to have cancer screening at various ages. This may include screening for:  Breast cancer.  Cervical cancer.  Colorectal cancer.  Skin cancer.  Lung cancer. What should I know about heart  disease, diabetes, and high blood pressure? Blood pressure and heart disease  High blood pressure causes heart disease and increases the risk of stroke. This is more likely to develop in people who have high blood pressure readings, are of African descent, or are overweight.  Have your blood pressure checked: ? Every 3-5 years if you are 66-41 years of age. ? Every year if you are 3 years old or older. Diabetes Have regular diabetes screenings. This checks your fasting blood sugar level. Have the screening done:  Once every three years after age 63 if you are at a normal weight and have a low risk for diabetes.  More often and at a younger age if you are overweight or have a high risk for diabetes. What should I know about preventing infection? Hepatitis B If you have a higher risk for hepatitis B, you should be screened for this virus. Talk with your health care provider to find out if you are at risk for hepatitis B infection. Hepatitis C Testing is recommended for:  Everyone born from [birth year] through 1964.  Anyone with known risk factors for hepatitis C. Sexually transmitted infections (STIs)  Get screened for STIs, including gonorrhea and chlamydia, if: ? You are sexually active and are younger than 44 years of age. ? You are older than 44 years of age and your health care provider tells you that you are at risk for this type of infection. ? Your sexual activity has changed since you were last screened, and you are at increased risk for  chlamydia or gonorrhea. Ask your health care provider if you are at risk.  Ask your health care provider about whether you are at high risk for HIV. Your health care provider may recommend a prescription medicine to help prevent HIV infection. If you choose to take medicine to prevent HIV, you should first get tested for HIV. You should then be tested every 3 months for as long as you are taking the medicine. Pregnancy  If you are about to stop  having your period (premenopausal) and you may become pregnant, seek counseling before you get pregnant.  Take 400 to 800 micrograms (mcg) of folic acid every day if you become pregnant.  Ask for birth control (contraception) if you want to prevent pregnancy. Osteoporosis and menopause Osteoporosis is a disease in which the bones lose minerals and strength with aging. This can result in bone fractures. If you are 79 years old or older, or if you are at risk for osteoporosis and fractures, ask your health care provider if you should:  Be screened for bone loss.  Take a calcium or vitamin D supplement to lower your risk of fractures.  Be given hormone replacement therapy (HRT) to treat symptoms of menopause. Follow these instructions at home: Lifestyle  Do not use any products that contain nicotine or tobacco, such as cigarettes, e-cigarettes, and chewing tobacco. If you need help quitting, ask your health care provider.  Do not use street drugs.  Do not share needles.  Ask your health care provider for help if you need support or information about quitting drugs. Alcohol use  Do not drink alcohol if: ? Your health care provider tells you not to drink. ? You are pregnant, may be pregnant, or are planning to become pregnant.  If you drink alcohol: ? Limit how much you use to 0-1 drink a day. ? Limit intake if you are breastfeeding.  Be aware of how much alcohol is in your drink. In the [place] one drink equals one 12 oz bottle of beer (355 mL), one 5 oz glass of wine (148 mL), or one 1 oz glass of hard liquor (44 mL). General instructions  Schedule regular health, dental, and eye exams.  Stay current with your vaccines.  Tell your health care provider if: ? You often feel depressed. ? You have ever been abused or do not feel safe at home. Summary  Adopting a healthy lifestyle and getting preventive care are important in promoting health and wellness.  Follow your health  care provider's instructions about healthy diet, exercising, and getting tested or screened for diseases.  Follow your health care provider's instructions on monitoring your cholesterol and blood pressure. This information is not intended to replace advice given to you by your health care provider. Make sure you discuss any questions you have with your health care provider. Document Revised: 11/02/2018 Document Reviewed: 11/02/2018 Elsevier Patient Education  2021 [employer].

## 2020-12-06 NOTE — Assessment & Plan Note (Signed)
Stable on adderall and prozac.

## 2020-12-06 NOTE — Progress Notes (Signed)
   Subjective:   Patient ID: Joanna Thompson, female    DOB: [birth year], 45 y.o.   MRN: [id number]  HPI The patient is a 45 YO female coming in for physical.   PMH, [place], social history reviewed and updated  Review of Systems  Constitutional: Negative.   HENT: Negative.   Eyes: Negative.   Respiratory: Negative for cough, chest tightness and shortness of breath.   Cardiovascular: Negative for chest pain, palpitations and leg swelling.  Gastrointestinal: Positive for diarrhea. Negative for abdominal distention, abdominal pain, constipation, nausea and vomiting.  Musculoskeletal: Negative.   Skin: Negative.   Neurological: Negative.   Psychiatric/Behavioral: Negative.     Objective:  Physical Exam Constitutional:      Appearance: She is well-developed and well-nourished.  HENT:     Head: Normocephalic and atraumatic.  Eyes:     Extraocular Movements: EOM normal.  Cardiovascular:     Rate and Rhythm: Normal rate and regular rhythm.  Pulmonary:     Effort: Pulmonary effort is normal. No respiratory distress.     Breath sounds: Normal breath sounds. No wheezing or rales.  Abdominal:     General: Bowel sounds are normal. There is no distension.     Palpations: Abdomen is soft.     Tenderness: There is no abdominal tenderness. There is no rebound.  Musculoskeletal:        General: No edema.     Cervical back: Normal range of motion.  Skin:    General: Skin is warm and dry.  Neurological:     Mental Status: She is alert and oriented to person, place, and time.     Coordination: Coordination normal.  Psychiatric:        Mood and Affect: Mood and affect normal.     Vitals:   12/06/20 0815  BP: 106/78  Pulse: 77  Temp: 98.3 F (36.8 C)  TempSrc: Oral  SpO2: 98%  Weight: 132 lb 12.8 oz (60.2 kg)  Height: 5\' 5"  (1.651 m)    This visit occurred during the SARS-CoV-2 public health emergency.  Safety protocols were in place, including screening questions prior to the  visit, additional usage of staff PPE, and extensive cleaning of exam room while observing appropriate contact time as indicated for disinfecting solutions.   Assessment & Plan:

## 2020-12-10 LAB — FOOD ALLERGY PROFILE
Allergen, Salmon, f41: <0.1 kU/L
Almonds: <0.1 kU/L
CLASS: 0
CLASS: 0
CLASS: 0
CLASS: 0
CLASS: 0
CLASS: 0
CLASS: 0
CLASS: 0
CLASS: 0
CLASS: 0
CLASS: 0
Cashew IgE: <0.1 kU/L
Class: 0
Class: 0
Class: 0
Class: 0
Egg White IgE: <0.1 kU/L
Fish Cod: <0.1 kU/L
Hazelnut: <0.1 kU/L
Milk IgE: <0.1 kU/L
Peanut IgE: <0.1 kU/L
Scallop IgE: <0.1 kU/L
Sesame Seed f10: <0.1 kU/L
Shrimp IgE: <0.1 kU/L
Soybean IgE: <0.1 kU/L
Tuna IgE: <0.1 kU/L
Walnut: <0.1 kU/L
Wheat IgE: <0.1 kU/L

## 2020-12-10 LAB — INTERPRETATION:

## 2020-12-10 LAB — HEPATITIS C ANTIBODY
Hepatitis C Ab: NONREACTIVE
SIGNAL TO CUT-OFF: 0.01 (ref <1.00)

## 2022-03-06 ENCOUNTER — Encounter: Payer: Managed Care, Other (non HMO) | Admitting: Internal Medicine

## 2022-03-17 ENCOUNTER — Ambulatory Visit (INDEPENDENT_AMBULATORY_CARE_PROVIDER_SITE_OTHER): Payer: Managed Care, Other (non HMO) | Admitting: Internal Medicine

## 2022-03-17 ENCOUNTER — Encounter: Payer: Self-pay | Admitting: Internal Medicine

## 2022-03-17 VITALS — BP 112/64 | HR 68 | Resp 18 | Ht 65.0 in | Wt 128.6 lb

## 2022-03-17 DIAGNOSIS — E063 Autoimmune thyroiditis: Secondary | ICD-10-CM | POA: Diagnosis not present

## 2022-03-17 DIAGNOSIS — Z91018 Allergy to other foods: Secondary | ICD-10-CM | POA: Diagnosis not present

## 2022-03-17 DIAGNOSIS — Z0000 Encounter for general adult medical examination without abnormal findings: Secondary | ICD-10-CM | POA: Diagnosis not present

## 2022-03-17 LAB — CBC
HCT: 41.1 % (ref 36.0–46.0)
Hemoglobin: 13.9 g/dL (ref 12.0–15.0)
MCHC: 33.8 g/dL (ref 30.0–36.0)
MCV: 91.3 fl (ref 78.0–100.0)
Platelets: 201 10*3/uL (ref 150.0–400.0)
RBC: 4.5 Mil/uL (ref 3.87–5.11)
RDW: 13.1 % (ref 11.5–15.5)
WBC: 5.5 10*3/uL (ref 4.0–10.5)

## 2022-03-17 LAB — T4, FREE: Free T4: 0.93 ng/dL (ref 0.60–1.60)

## 2022-03-17 LAB — LIPID PANEL
Cholesterol: 182 mg/dL (ref 0–200)
HDL: 85.8 mg/dL (ref >39.00)
LDL Cholesterol: 87 mg/dL (ref 0–99)
NonHDL: 95.7
Total CHOL/HDL Ratio: 2
Triglycerides: 42 mg/dL (ref 0.0–149.0)
VLDL: 8.4 mg/dL (ref 0.0–40.0)

## 2022-03-17 LAB — TSH: TSH: 2.86 u[IU]/mL (ref 0.35–5.50)

## 2022-03-17 LAB — COMPREHENSIVE METABOLIC PANEL
ALT: 17 U/L (ref 0–35)
AST: 19 U/L (ref 0–37)
Albumin: 4.4 g/dL (ref 3.5–5.2)
Alkaline Phosphatase: 32 U/L — ABNORMAL LOW (ref 39–117)
BUN: 20 mg/dL (ref 6–23)
CO2: 25 mEq/L (ref 19–32)
Calcium: 9.3 mg/dL (ref 8.4–10.5)
Chloride: 103 mEq/L (ref 96–112)
Creatinine, Ser: 0.9 mg/dL (ref 0.40–1.20)
GFR: 77.43 mL/min (ref >60.00)
Glucose, Bld: 103 mg/dL — ABNORMAL HIGH (ref 70–99)
Potassium: 4 mEq/L (ref 3.5–5.1)
Sodium: 137 mEq/L (ref 135–145)
Total Bilirubin: 0.7 mg/dL (ref 0.2–1.2)
Total Protein: 6.8 g/dL (ref 6.0–8.3)

## 2022-03-17 LAB — VITAMIN D 25 HYDROXY (VIT D DEFICIENCY, FRACTURES): VITD: 71.69 ng/mL (ref 30.00–100.00)

## 2022-03-17 NOTE — Assessment & Plan Note (Signed)
Flu shot not indicated. Covid-19 counseled. Tetanus up to date. Colonoscopy up to date. Mammogram up to date, pap smear up to date with gyn. Counseled about sun safety and mole surveillance. Counseled about the dangers of distracted driving. Given 10 year screening recommendations.  ? ?

## 2022-03-17 NOTE — Progress Notes (Signed)
? ?  Subjective:  ? ?Patient ID: Joanna Thompson, female    DOB: December 28, 1975, 46 y.o.   MRN: [id number] ? ?HPI ?The patient is here for physical. ? ?PMH, [facility], social history reviewed and updated ? ?Review of Systems  ?Constitutional: Negative.   ?HENT: Negative.    ?Eyes: Negative.   ?Respiratory:  Negative for cough, chest tightness and shortness of breath.   ?Cardiovascular:  Negative for chest pain, palpitations and leg swelling.  ?Gastrointestinal:  Positive for abdominal pain and constipation. Negative for abdominal distention, diarrhea, nausea and vomiting.  ?Musculoskeletal: Negative.   ?Skin: Negative.   ?Neurological: Negative.   ?Psychiatric/Behavioral: Negative.    ? ?Objective:  ?Physical Exam ?Constitutional:   ?   Appearance: She is well-developed.  ?HENT:  ?   Head: Normocephalic and atraumatic.  ?Cardiovascular:  ?   Rate and Rhythm: Normal rate and regular rhythm.  ?Pulmonary:  ?   Effort: Pulmonary effort is normal. No respiratory distress.  ?   Breath sounds: Normal breath sounds. No wheezing or rales.  ?Abdominal:  ?   General: Bowel sounds are normal. There is no distension.  ?   Palpations: Abdomen is soft.  ?   Tenderness: There is no abdominal tenderness. There is no rebound.  ?Musculoskeletal:  ?   Cervical back: Normal range of motion.  ?Skin: ?   General: Skin is warm and dry.  ?Neurological:  ?   Mental Status: She is alert and oriented to person, place, and time.  ?   Coordination: Coordination normal.  ? ? ?Vitals:  ? 03/17/22 0808  ?BP: 112/64  ?Pulse: 68  ?Resp: 18  ?SpO2: 98%  ?Weight: 128 lb 9.6 oz (58.3 kg)  ?Height: '5\' 5"'$  (1.651 m)  ? ? ?This visit occurred during the SARS-CoV-2 public health emergency.  Safety protocols were in place, including screening questions prior to the visit, additional usage of staff PPE, and extensive cleaning of exam room while observing appropriate contact time as indicated for disinfecting solutions.  ? ?Assessment & Plan:  ? ?

## 2022-03-17 NOTE — Assessment & Plan Note (Signed)
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 Referral done to allergy.  ?

## 2022-03-17 NOTE — Assessment & Plan Note (Signed)
Checking TSH and free T4 today and adjust as needed synthroid 125 mcg daily.  ?

## 2022-04-07 ENCOUNTER — Telehealth: Payer: Self-pay | Admitting: Internal Medicine

## 2022-04-07 NOTE — Telephone Encounter (Signed)
I'm not sure what to do with this can you help? ?

## 2022-04-07 NOTE — Telephone Encounter (Signed)
Insurance sent denial stating that is is missing ICD 10 is missing for Vitamin D Test. ? ?Christella Scheuermann is her insurance.  ?

## 2022-04-09 ENCOUNTER — Ambulatory Visit: Payer: Managed Care, Other (non HMO) | Admitting: Allergy

## 2022-04-09 ENCOUNTER — Encounter: Payer: Self-pay | Admitting: Allergy

## 2022-04-09 VITALS — BP 110/68 | HR 84 | Temp 98.4°F | Resp 16 | Ht 65.0 in | Wt 129.0 lb

## 2022-04-09 DIAGNOSIS — T781XXA Other adverse food reactions, not elsewhere classified, initial encounter: Secondary | ICD-10-CM | POA: Diagnosis not present

## 2022-04-09 DIAGNOSIS — J310 Chronic rhinitis: Secondary | ICD-10-CM

## 2022-04-09 DIAGNOSIS — T781XXD Other adverse food reactions, not elsewhere classified, subsequent encounter: Secondary | ICD-10-CM

## 2022-04-09 NOTE — Patient Instructions (Addendum)
-   we have discussed the following in regards to foods:   Allergy: food allergy is when you have eaten a food, developed an allergic reaction after eating the food and have IgE to the food (positive food testing either by skin testing or blood testing).  Food allergy could lead to life threatening symptoms  Sensitivity: occurs when you have IgE to a food (positive food testing either by skin testing or blood testing) but is a food you eat without any issues.  This is not an allergy and we recommend keeping the food in the diet  Intolerance: this is when you have negative testing by either skin testing or blood testing thus not allergic but the food causes symptoms (like belly pain, bloating, diarrhea etc) with ingestion.  These foods should be avoided to prevent symptoms.    - food allergy testing is negative thus you do not have detectable IgE to these foods and not concerned for food allergy.   Our testing is not able to rule out food intolerances.    - mold allergy testing is positive to primarily outdoor molds  Follow-up as needed I think my right

## 2022-04-09 NOTE — Progress Notes (Signed)
New Patient Note  RE: Joanna Thompson MRN: [id number] DOB: 1976-02-26 Date of Office Visit: 04/09/2022  Primary care provider: Hoyt Koch, MD  Chief Complaint: ?food intolerance  History of present illness: Joanna Thompson is a 46 y.o. female presenting today for evaluation of adverse food reaction.  She is trying to figure out if she has intolerance to foods.   She states when she eats protein like chicken, shrimp she has this sensation like she has to go to the bathroom and within 3-5 minutes of eating protein she states she is having pain that resembles childbirth like a head crowning in the rectum area.   She states she has to limit the foods she eats.  She also is wondering if almonds has an issue.  She also states leafy greens also has worsenend this.   She states the symptoms have been intensifying for the past 5 years.  She states the symptoms happen in different environments like even on vacations.  She has had several colonoscopies that were normal.   She has had an [place] of gallbladder and states there was one 1 polyp visualized.   She does try to drink about 60 oz of water daily. She has 1 cup of coffee a day.  No nausea or vomiting.  She denies constipation or diarrhea.  She has been taking colace however.  She denies continues, respiratory or CV related with the rectal pain.   No history of eczema, asthma, food allergy. Reports minimal symptoms related to seasonal allergies like congestion, sneezing.  She states she does work in an environment that has mold and she is curious if she has a mold allergy.  She does report 2-3 years ago she had contact with poison oak/ivy and she has reaction on legs where she did see dermatology and needed dressings for the legs to treat the rash.    Review of systems: Review of Systems  Constitutional: Negative.   HENT: Negative.    Eyes: Negative.   Respiratory: Negative.    Cardiovascular: Negative.   Gastrointestinal:        See  HPI  Musculoskeletal: Negative.   Skin: Negative.   Allergic/Immunologic: Negative.   Neurological: Negative.    All other systems negative unless noted above in HPI  Past medical history: Past Medical History:  Diagnosis Date   ADHD (attention deficit hyperactivity disorder)    Anemia, iron deficiency    Generalized headaches    GERD (gastroesophageal reflux disease)    H/O bulimia nervosa    H/O bulimia nervosa    History of chicken pox 1984   Hypothyroidism    Melanoma in situ ([facility]) 2008   anterior abd - follows with derm annually   Migraine    Missed abortion     Past surgical history: Past Surgical History:  Procedure Laterality Date   AUGMENTATION MAMMAPLASTY Bilateral 2006   PLACEMENT OF BREAST IMPLANTS     RHINOPLASTY     TONSILLECTOMY AND ADENOIDECTOMY     WISDOM TOOTH EXTRACTION      Family history:  Family History  Problem Relation Age of Onset   Colon cancer Maternal Grandmother 50   Thyroid cancer Father    Cancer Father        thyroid    Colon polyps Paternal Grandmother     Social history: Lives in a home without carpeting with gas heating and central, window and fan cooling.  Dog in the home.  There is concern for mildew  in the home.  There is no concern for roaches in the home.  She is a Corporate treasurer.  She denies a smoking history.   Medication List: Current Outpatient Medications  Medication Sig Dispense Refill   amphetamine-dextroamphetamine (ADDERALL XR) 25 MG 24 hr capsule Take 25 mg by mouth daily.     BIOTIN PO Take 2 tablets by mouth daily.     Eszopiclone 3 MG TABS TAKE ONE TABLET BY MOUTH EVERY NIGHT AIMMEDIATELY BEFORE BEDTIME 30 tablet 4   FLUoxetine (PROZAC) 40 MG capsule Take 40 mg by mouth daily.     hydrOXYzine (ATARAX) 10 MG tablet Take 10-20 mg by mouth at bedtime.     levonorgestrel (MIRENA) 20 MCG/24HR IUD 1 each by Intrauterine route once.     Multiple Vitamin (MULTIVITAMIN) tablet Take 1 tablet by  mouth daily.     SYNTHROID 125 MCG tablet Take 125 mcg by mouth at bedtime. 1/2 tablet once a week     No current facility-administered medications for this visit.    Known medication allergies: Allergies  Allergen Reactions   Analgesia [Trolamine (Triethanolamine)] Other (See Comments)    Gets very nauseated with anesthesia   Codeine Nausea And Vomiting   Influenza Vaccines Hives and Swelling   Betadine [Povidone Iodine] Rash     Physical examination: Blood pressure 110/68, pulse 84, temperature 98.4 F (36.9 C), resp. rate 16, height '5\' 5"'$  (1.651 m), weight 129 lb (58.5 kg), SpO2 99 %.  General: Alert, interactive, in no acute distress. HEENT: PERRLA, TMs pearly gray, turbinates non-edematous without discharge, post-pharynx non erythematous. Neck: Supple without lymphadenopathy. Lungs: Clear to auscultation without wheezing, rhonchi or rales. {no increased work of breathing. CV: Normal S1, S2 without murmurs. Abdomen: Nondistended, nontender. Skin: Warm and dry, without lesions or rashes. Extremities:  No clubbing, cyanosis or edema. Neuro:   Grossly intact.  Diagnositics/Labs:  Allergy testing:   Airborne Adult Perc - 04/09/22 0931     Time Antigen Placed 3893    Allergen Manufacturer Lavella Hammock    Location Back    Number of Test 15    36. Alternaria alternata Negative    37. Cladosporium Herbarum Negative    38. Aspergillus mix Negative    39. Penicillium mix Negative    40. Bipolaris sorokiniana (Helminthosporium) Negative    41. Drechslera spicifera (Curvularia) 2+    42. Mucor plumbeus Negative    43. Fusarium moniliforme Negative    44. Aureobasidium pullulans (pullulara) 2+    45. Rhizopus oryzae Negative    46. Botrytis cinera Negative    47. Epicoccum nigrum Negative    48. Phoma betae Negative    49. Candida Albicans Negative    50. Trichophyton mentagrophytes Negative             Food Adult Perc - 04/09/22 0900     Time Antigen Placed 7342     Allergen Manufacturer Lavella Hammock    Location Back    Number of allergen test 25     Control-buffer 50% Glycerol Negative    Control-Histamine 1 mg/ml 2+    1. Peanut Negative    2. Soybean Negative    3. Wheat Negative    4. Sesame Negative    5. Milk, cow Negative    6. Egg White, Chicken Negative    7. Casein Negative    8. Shellfish Mix Negative    11. Pecan Food Negative    12. [place] Negative    13.  Almond Negative    25. Shrimp Negative    34. Rice Negative    36. Saccharomyces Cerevisiae  Negative    38. [place] Meat Negative    39. Chicken Meat Negative    42. Tomato Negative    45. Pea, Green/English Negative    48. Avocado Negative    49. Onion Negative    50. Cabbage Negative    53. Corn Negative    70. Garlic Negative             Allergy testing results were read and interpreted by provider, documented by clinical staff.   Assessment and plan: Adverse food reaction Rhinitis  - we have discussed the following in regards to foods:   Allergy: food allergy is when you have eaten a food, developed an allergic reaction after eating the food and have IgE to the food (positive food testing either by skin testing or blood testing).  Food allergy could lead to life threatening symptoms  Sensitivity: occurs when you have IgE to a food (positive food testing either by skin testing or blood testing) but is a food you eat without any issues.  This is not an allergy and we recommend keeping the food in the diet  Intolerance: this is when you have negative testing by either skin testing or blood testing thus not allergic but the food causes symptoms (like belly pain, bloating, diarrhea etc) with ingestion.  These foods should be avoided to prevent symptoms.    - food allergy testing is negative thus you do not have detectable IgE to these foods and not concerned for food allergy.   Our testing is not able to rule out food intolerances.    - mold allergy testing is positive  to primarily outdoor molds  Follow-up as needed  I appreciate the opportunity to take part in Joanna Thompson's care. Please do not hesitate to contact me with questions.  Sincerely,   Prudy Feeler, MD Allergy/Immunology [facility]

## 2022-05-07 NOTE — Telephone Encounter (Signed)
The code used was z00.00 which may not or may cover vitamin d. She does not have deficiency and this cannot be used for this.

## 2022-07-21 ENCOUNTER — Ambulatory Visit: Payer: 59 | Admitting: Obstetrics & Gynecology

## 2022-07-21 ENCOUNTER — Encounter: Payer: Self-pay | Admitting: Obstetrics & Gynecology

## 2022-07-21 ENCOUNTER — Telehealth: Payer: Self-pay

## 2022-07-21 VITALS — BP 104/62 | HR 90

## 2022-07-21 DIAGNOSIS — N644 Mastodynia: Secondary | ICD-10-CM

## 2022-07-21 NOTE — Telephone Encounter (Signed)
-----   Message from [name], MD sent at 07/21/2022 10:56 AM EDT ----- Regarding: Schedule Bilateral Dx mammo/Breast US Bilateral breast with implants exam normal, except tender at Rt upper external breast deeply, intercostal?  And tender at Lt mid external breast deeply, intercostal?

## 2022-07-21 NOTE — Progress Notes (Signed)
    Joanna Thompson 20-Jun-1976 [id number]        46 y.o.  G1P1L1   RP: Bilateral breast/chest pain x 5 days  HPI: Bilateral breast/chest pain x 5 days.  Very localized points of pain at the Rt upper external quadrant and the Lt mid external breast.  Patient has bilateral silicone breast implants.  No lump felt.  Last mammo Neg 01/2019.  No SOB.  No change in pain with breathing or movements.  No new fitness program.  No fall.   OB History  Gravida Para Term Preterm AB Living  '1 1 1     1  '$ SAB IAB Ectopic Multiple Live Births               # Outcome Date GA Lbr Len/2nd Weight Sex Delivery Anes PTL Lv  1 Term             Past medical history,surgical history, problem list, medications, allergies, family history and social history were all reviewed and documented in the EPIC chart.   Directed ROS with pertinent positives and negatives documented in the history of present illness/assessment and plan.  Exam:  Vitals:   07/21/22 1029  BP: 104/62  Pulse: 90  SpO2: 99%   General appearance:  Normal  Breast exam:  Bilateral breast with implants, exam normal, no nodule or mass.  No evidence of leakage from implants. Skin normal.  Nipples normal, no discharge.  Tender at Rt upper external breast deeply, intercostal?  Tender at Lt mid external breast deeply, intercostal?     Assessment/Plan:  46 y.o. G1P1001   1. Bilateral Breast pain in female Bilateral breast/chest pain x 5 days.  Very localized points of pain at the Rt upper external quadrant and the Lt mid external breast.  Patient has bilateral silicone breast implants.  No lump felt.  Last mammo Neg 01/2019.  No SOB.  No change in pain with breathing or movements.  No new fitness program.  No fall. Bilateral breast exam normal.  Tender at Rt upper external breast deeply, intercostal?  Tender at Lt mid external breast deeply, intercostal?  Will proceed with Bilateral Dx mammo/Breast US.  Recommend to contact her Fam MD to investigate  as well the possibility of chest wall pathology.  Other orders - amphetamine-dextroamphetamine (ADDERALL XR) 10 MG 24 hr capsule; Take 10 mg by mouth as needed.   [name] MD, 10:46 AM 07/21/2022

## 2022-07-21 NOTE — Telephone Encounter (Signed)
FYI. Pt scheduled for first available on 08/11/22 @ 940.  Pt notified of appt date/time and to call daily if she desires to check for cancellations. Pt voiced understanding.

## 2022-08-11 ENCOUNTER — Ambulatory Visit
Admission: RE | Admit: 2022-08-11 | Discharge: 2022-08-11 | Disposition: A | Discharge status: Home / Self Care | Payer: Managed Care, Other (non HMO) | Source: Ambulatory Visit | Attending: Obstetrics & Gynecology | Admitting: Obstetrics & Gynecology

## 2022-08-11 ENCOUNTER — Ambulatory Visit
Admission: RE | Admit: 2022-08-11 | Discharge: 2022-08-11 | Disposition: A | Discharge status: Home / Self Care | Payer: 59 | Source: Ambulatory Visit | Attending: Obstetrics & Gynecology | Admitting: Obstetrics & Gynecology

## 2022-08-11 ENCOUNTER — Ambulatory Visit: Payer: Managed Care, Other (non HMO)

## 2022-08-11 DIAGNOSIS — N644 Mastodynia: Secondary | ICD-10-CM

## 2022-08-17 ENCOUNTER — Encounter: Payer: Self-pay | Admitting: Obstetrics & Gynecology

## 2022-08-17 ENCOUNTER — Ambulatory Visit: Payer: 59 | Admitting: Obstetrics & Gynecology

## 2022-08-17 ENCOUNTER — Other Ambulatory Visit (HOSPITAL_COMMUNITY)
Admission: RE | Admit: 2022-08-17 | Discharge: 2022-08-17 | Disposition: A | Discharge status: Home / Self Care | Payer: 59 | Source: Ambulatory Visit | Attending: Obstetrics & Gynecology | Admitting: Obstetrics & Gynecology

## 2022-08-17 VITALS — BP 124/72 | HR 88 | Ht 64.57 in | Wt 132.0 lb

## 2022-08-17 DIAGNOSIS — Z01419 Encounter for gynecological examination (general) (routine) without abnormal findings: Secondary | ICD-10-CM | POA: Diagnosis not present

## 2022-08-17 DIAGNOSIS — Z30431 Encounter for routine checking of intrauterine contraceptive device: Secondary | ICD-10-CM | POA: Diagnosis not present

## 2022-08-17 NOTE — H&P (Signed)
Joanna Thompson 10/03/1976 [id number]   History:    46 y.o. G1P1L1 Married.  NP working in substance abuse.  Daughter [name], [employer] 9th grader.   RP: Established patient presenting for annual gyn exam    HPI: Well on Mirena IUD x 05/10/2019.  No BTB.  No pelvic pain.  No pain with IC. Pap Neg in 01/2018. Pap reflex today.  Urine/BMs normal.  Colono 2015.  Breasts normal, s/p bilateral augmentation. Mammo 08/11/22 Bilateral Dx/Rt [place] all Negative. BMI 22.26.  Walking. Health labs with Fam MD.    Past medical history,surgical history, family history and social history were all reviewed and documented in the EPIC chart.  Gynecologic History No LMP recorded. (Menstrual status: IUD).  Obstetric History OB History  Gravida Para Term Preterm AB Living  '1 1 1     1  '$ SAB IAB Ectopic Multiple Live Births               # Outcome Date GA Lbr Len/2nd Weight Sex Delivery Anes PTL Lv  1 Term              ROS: A ROS was performed and pertinent positives and negatives are included in the history. GENERAL: No fevers or chills. HEENT: No change in vision, no earache, sore throat or sinus congestion. NECK: No pain or stiffness. CARDIOVASCULAR: No chest pain or pressure. No palpitations. PULMONARY: No shortness of breath, cough or wheeze. GASTROINTESTINAL: No abdominal pain, nausea, vomiting or diarrhea, melena or bright red blood per rectum. GENITOURINARY: No urinary frequency, urgency, hesitancy or dysuria. MUSCULOSKELETAL: No joint or muscle pain, no back pain, no recent trauma. DERMATOLOGIC: No rash, no itching, no lesions. ENDOCRINE: No polyuria, polydipsia, no heat or cold intolerance. No recent change in weight. HEMATOLOGICAL: No anemia or easy bruising or bleeding. NEUROLOGIC: No headache, seizures, numbness, tingling or weakness. PSYCHIATRIC: No depression, no loss of interest in normal activity or change in sleep pattern.     Exam:   BP 124/72   Pulse 88   Ht 5' 4.57" (1.64 m)   Wt 132 lb (59.9  kg)   SpO2 99%   BMI 22.26 kg/m   Body mass index is 22.26 kg/m.  General appearance : Well developed well nourished female. No acute distress HEENT: Eyes: no retinal hemorrhage or exudates,  Neck supple, trachea midline, no carotid bruits, no thyroidmegaly Lungs: Clear to auscultation, no rhonchi or wheezes, or rib retractions  Heart: Regular rate and rhythm, no murmurs or gallops Breast:Examined in sitting and supine position were symmetrical in appearance, no palpable masses or tenderness,  no skin retraction, no nipple inversion, no nipple discharge, no skin discoloration, no axillary or supraclavicular lymphadenopathy Abdomen: no palpable masses or tenderness, no rebound or guarding Extremities: no edema or skin discoloration or tenderness  Pelvic: Vulva: Normal             Vagina: No gross lesions or discharge  Cervix: No gross lesions or discharge.  IUD strings visible at [facility].  Pap reflex done.  Uterus  AV, normal size, shape and consistency, non-tender and mobile  Adnexa  Without masses or tenderness  Anus: Normal   Assessment/Plan:  46 y.o. female for annual exam   1. Encounter for routine gynecological examination with Papanicolaou smear of cervix Well on Mirena IUD x 05/10/2019.  No BTB.  No pelvic pain.  No pain with IC. Pap Neg in 01/2018. Pap reflex today.  Urine/BMs normal.  Colono 2015.  Breasts normal, s/p  bilateral augmentation. Mammo 08/11/22 Bilateral Dx/Rt [place] all Negative. BMI 22.26.  Walking. Health labs with Fam MD.  - Cytology - PAP( [place])  2. Encounter for routine checking of intrauterine contraceptive device (IUD)  Well on Mirena IUD x 05/10/2019.  No BTB.  No pelvic pain.  No pain with IC. Mirena IUD in good position, well tolerated.  [name] MD, 3:55 PM 08/17/2022

## 2022-08-17 NOTE — Progress Notes (Signed)
Visit  08/17/2022 [facility], Truddie Hidden, MD Obstetrics and Gynecology Encounter for routine gynecological examination with Papanicolaou smear of cervix +1 more Dx Gynecologic Exam ; Referred by Hoyt Koch, MD Reason for Visit   Additional Documentation  Vitals:  BP 124/72 Pulse 88 Ht 5' 4.57" (1.64 m) Wt 132 lb (59.9 kg) SpO2 99% BMI 22.26 kg/m BSA 1.65 m  Flowsheets:  NEWS, MEWS Score, Anthropometrics, Method of Visit   Encounter Info:  Billing Info, History, Allergies, Detailed Report    All Notes   Progress Notes by [name], MD at 08/17/2022 3:30 PM  Author: [name], MD Author Type: Physician Filed: 08/17/2022  4:21 PM  Note Status: Signed Cosign: Cosign Not Required Encounter Date: 08/17/2022  Editor: [name], MD (Physician)             Annual Gyn visit.  No complaint.        H&P by [name], MD at 08/17/2022 3:30 PM  Author: [name], MD Author Type: Physician Filed: 08/17/2022  4:18 PM  Note Status: Signed Cosign: Cosign Not Required Encounter Date: 08/17/2022  Editor: [name], MD (Physician)                  Joanna Thompson January 12, 1976        [id number]     History:    46 y.o. G1P1L1 Married.  NP working in substance abuse.  Daughter [name], [employer] 9th grader.   RP: Established patient presenting for annual gyn exam    HPI: Well on Mirena IUD x 05/10/2019.  No BTB.  No pelvic pain.  No pain with IC. Pap Neg in 01/2018. Pap reflex today.  Urine/BMs normal.  Colono 2015.  Breasts normal, s/p bilateral augmentation. Mammo 08/11/22 Bilateral Dx/Rt [place] all Negative. BMI 22.26.  Walking. Health labs with Fam MD.      Past medical history,surgical history, family history and social history were all reviewed and documented in the EPIC chart.   Gynecologic History No LMP recorded. (Menstrual status: IUD).   Obstetric History                 OB History  Gravida Para  Term Preterm AB Living  '1 1 1     1  '$ SAB IAB Ectopic Multiple Live Births                      # Outcome Date GA Lbr Len/2nd Weight Sex Delivery Anes PTL Lv  1 Term                          ROS: A ROS was performed and pertinent positives and negatives are included in the history. GENERAL: No fevers or chills. HEENT: No change in vision, no earache, sore throat or sinus congestion. NECK: No pain or stiffness. CARDIOVASCULAR: No chest pain or pressure. No palpitations. PULMONARY: No shortness of breath, cough or wheeze. GASTROINTESTINAL: No abdominal pain, nausea, vomiting or diarrhea, melena or bright red blood per rectum. GENITOURINARY: No urinary frequency, urgency, hesitancy or dysuria. MUSCULOSKELETAL: No joint or muscle pain, no back pain, no recent trauma. DERMATOLOGIC: No rash, no itching, no lesions. ENDOCRINE: No polyuria, polydipsia, no heat or cold intolerance. No recent change in weight. HEMATOLOGICAL: No anemia or easy bruising or bleeding. NEUROLOGIC: No headache, seizures, numbness, tingling or weakness. PSYCHIATRIC: No depression, no loss of interest in normal activity or change in sleep pattern.  Exam:     BP 124/72   Pulse 88   Ht 5' 4.57" (1.64 m)   Wt 132 lb (59.9 kg)   SpO2 99%   BMI 22.26 kg/m    Body mass index is 22.26 kg/m.   General appearance : Well developed well nourished female. No acute distress HEENT: Eyes: no retinal hemorrhage or exudates,  Neck supple, trachea midline, no carotid bruits, no thyroidmegaly Lungs: Clear to auscultation, no rhonchi or wheezes, or rib retractions  Heart: Regular rate and rhythm, no murmurs or gallops Breast:Examined in sitting and supine position were symmetrical in appearance, no palpable masses or tenderness,  no skin retraction, no nipple inversion, no nipple discharge, no skin discoloration, no axillary or supraclavicular lymphadenopathy Abdomen: no palpable masses or tenderness, no rebound or  guarding Extremities: no edema or skin discoloration or tenderness   Pelvic: Vulva: Normal             Vagina: No gross lesions or discharge             Cervix: No gross lesions or discharge.  [facility]UD strings visible at [facility].  Pap reflex done.             Uterus  AV, normal size, shape and consistency, non-tender and mobile             Adnexa  Without masses or tenderness             Anus: Normal     Assessment/Plan:  46 y.o. female for annual exam    1. Encounter for routine gynecological examination with Papanicolaou smear of cervix Well on Mirena IUD x 05/10/2019.  No BTB.  No pelvic pain.  No pain with IC. Pap Neg in 01/2018. Pap reflex today.  Urine/BMs normal.  Colono 2015.  Breasts normal, s/p bilateral augmentation. Mammo 08/11/22 Bilateral Dx/Rt [place] all Negative. BMI 22.26.  Walking. Health labs with Fam MD.  - Cytology - PAP( [place])   2. Encounter for routine checking of intrauterine contraceptive device (IUD)  Well on Mirena IUD x 05/10/2019.  No BTB.  No pelvic pain.  No pain with IC. Mirena IUD in good position, well tolerated.   [name] MD, 3:55 PM 08/17/2022

## 2022-08-17 NOTE — Progress Notes (Deleted)
46 y.o. G60P1001 Married White or Caucasian Not Hispanic or Latino female here for annual exam.      No LMP recorded. (Menstrual status: IUD).          Sexually active: {yes no:314532}  The current method of family planning is {contraception:315051}.    Exercising: {yes no:314532}  {types:19826} Smoker:  {YES NO:22349}  Health Maintenance: Pap:  *** History of abnormal Pap:  {YES NO:22349} MMG:  *** BMD:   *** Colonoscopy: *** TDaP:  *** Gardasil: ***   reports that she has never smoked. She has never used smokeless tobacco. She reports current alcohol use. She reports that she does not use drugs.  Past Medical History:  Diagnosis Date   ADHD (attention deficit hyperactivity disorder)    Anemia, iron deficiency    Generalized headaches    GERD (gastroesophageal reflux disease)    H/O bulimia nervosa    H/O bulimia nervosa    History of chicken pox 1984   Hypothyroidism    Melanoma in situ ([place]) 2008   anterior abd - follows with derm annually   Migraine    Missed abortion     Past Surgical History:  Procedure Laterality Date   AUGMENTATION MAMMAPLASTY Bilateral 2006   PLACEMENT OF BREAST IMPLANTS     RHINOPLASTY     TONSILLECTOMY AND ADENOIDECTOMY     WISDOM TOOTH EXTRACTION      Current Outpatient Medications  Medication Sig Dispense Refill   amphetamine-dextroamphetamine (ADDERALL XR) 10 MG 24 hr capsule Take 10 mg by mouth as needed.     amphetamine-dextroamphetamine (ADDERALL XR) 25 MG 24 hr capsule Take 25 mg by mouth daily.     BIOTIN PO Take by mouth daily.     Eszopiclone 3 MG TABS TAKE ONE TABLET BY MOUTH EVERY NIGHT AIMMEDIATELY BEFORE BEDTIME 30 tablet 4   FLUoxetine (PROZAC) 40 MG capsule Take 40 mg by mouth daily.     hydrOXYzine (ATARAX) 10 MG tablet Take 10-20 mg by mouth at bedtime.     levonorgestrel (MIRENA) 20 MCG/24HR IUD 1 each by Intrauterine route once.     Multiple Vitamin (MULTIVITAMIN) tablet Take 1 tablet by mouth daily.     SYNTHROID  125 MCG tablet Take 125 mcg by mouth at bedtime.     No current facility-administered medications for this visit.    Family History  Problem Relation Age of Onset   Colon cancer Maternal Grandmother 62   Thyroid cancer Father    Cancer Father        thyroid    Colon polyps Paternal Grandmother     Review of Systems  Exam:   There were no vitals taken for this visit.  Weight change: '@WEIGHTCHANGE'$ @ Height:      Ht Readings from Last 3 Encounters:  04/09/22 '5\' 5"'$  (1.651 m)  03/17/22 '5\' 5"'$  (1.651 m)  12/06/20 '5\' 5"'$  (1.651 m)    General appearance: alert, cooperative and appears stated age Head: Normocephalic, without obvious abnormality, atraumatic Neck: no adenopathy, supple, symmetrical, trachea midline and thyroid {CHL AMB PHY EX THYROID NORM DEFAULT:[phone]::"normal to inspection and palpation"} Lungs: clear to auscultation bilaterally Cardiovascular: regular rate and rhythm Breasts: {Exam; breast:13139::"normal appearance, no masses or tenderness"} Abdomen: soft, non-tender; non distended,  no masses,  no organomegaly Extremities: extremities normal, atraumatic, no cyanosis or edema Skin: Skin color, texture, turgor normal. No rashes or lesions Lymph nodes: Cervical, supraclavicular, and axillary nodes normal. No abnormal inguinal nodes palpated Neurologic: Grossly normal   Pelvic:  External genitalia:  no lesions              Urethra:  normal appearing urethra with no masses, tenderness or lesions              Bartholins and Skenes: normal                 Vagina: normal appearing vagina with normal color and discharge, no lesions              Cervix: {CHL AMB PHY EX CERVIX NORM DEFAULT:[phone]::"no lesions"}               Bimanual Exam:  Uterus:  {CHL AMB PHY EX UTERUS NORM DEFAULT:[phone]::"normal size, contour, position, consistency, mobility, non-tender"}              Adnexa: {CHL AMB PHY EX ADNEXA NO MASS DEFAULT:[phone]::"no mass, fullness,  tenderness"}               Rectovaginal: Confirms               Anus:  normal sphincter tone, no lesions  *** chaperoned for the exam.  A:  Well Woman with normal exam  P:

## 2022-08-20 LAB — CYTOLOGY - PAP: Diagnosis: NEGATIVE

## 2023-03-23 ENCOUNTER — Encounter: Payer: Self-pay | Admitting: Internal Medicine

## 2023-03-23 ENCOUNTER — Ambulatory Visit (INDEPENDENT_AMBULATORY_CARE_PROVIDER_SITE_OTHER): Payer: 59 | Admitting: Internal Medicine

## 2023-03-23 VITALS — BP 116/80 | HR 63 | Temp 98.1°F | Ht 64.57 in | Wt 137.0 lb

## 2023-03-23 DIAGNOSIS — E063 Autoimmune thyroiditis: Secondary | ICD-10-CM | POA: Diagnosis not present

## 2023-03-23 DIAGNOSIS — G43709 Chronic migraine without aura, not intractable, without status migrainosus: Secondary | ICD-10-CM

## 2023-03-23 DIAGNOSIS — Z13220 Encounter for screening for lipoid disorders: Secondary | ICD-10-CM

## 2023-03-23 DIAGNOSIS — R41840 Attention and concentration deficit: Secondary | ICD-10-CM | POA: Insufficient documentation

## 2023-03-23 DIAGNOSIS — U099 Post covid-19 condition, unspecified: Secondary | ICD-10-CM

## 2023-03-23 DIAGNOSIS — Z0000 Encounter for general adult medical examination without abnormal findings: Secondary | ICD-10-CM

## 2023-03-23 LAB — CBC
HCT: 40.5 % (ref 36.0–46.0)
Hemoglobin: 13.7 g/dL (ref 12.0–15.0)
MCHC: 33.9 g/dL (ref 30.0–36.0)
MCV: 91.7 fl (ref 78.0–100.0)
Platelets: 207 10*3/uL (ref 150.0–400.0)
RBC: 4.42 Mil/uL (ref 3.87–5.11)
RDW: 13.5 % (ref 11.5–15.5)
WBC: 5.1 10*3/uL (ref 4.0–10.5)

## 2023-03-23 LAB — VITAMIN D 25 HYDROXY (VIT D DEFICIENCY, FRACTURES): VITD: 54.34 ng/mL (ref 30.00–100.00)

## 2023-03-23 LAB — COMPREHENSIVE METABOLIC PANEL
ALT: 14 U/L (ref 0–35)
AST: 19 U/L (ref 0–37)
Albumin: 4.1 g/dL (ref 3.5–5.2)
Alkaline Phosphatase: 30 U/L — ABNORMAL LOW (ref 39–117)
BUN: 23 mg/dL (ref 6–23)
CO2: 28 mEq/L (ref 19–32)
Calcium: 9 mg/dL (ref 8.4–10.5)
Chloride: 103 mEq/L (ref 96–112)
Creatinine, Ser: 0.88 mg/dL (ref 0.40–1.20)
GFR: 78.98 mL/min (ref >60.00)
Glucose, Bld: 93 mg/dL (ref 70–99)
Potassium: 4.2 mEq/L (ref 3.5–5.1)
Sodium: 137 mEq/L (ref 135–145)
Total Bilirubin: 0.6 mg/dL (ref 0.2–1.2)
Total Protein: 6.5 g/dL (ref 6.0–8.3)

## 2023-03-23 LAB — TSH: TSH: 2.13 u[IU]/mL (ref 0.35–5.50)

## 2023-03-23 LAB — T4, FREE: Free T4: 1.09 ng/dL (ref 0.60–1.60)

## 2023-03-23 LAB — LIPID PANEL
Cholesterol: 183 mg/dL (ref 0–200)
HDL: 76.8 mg/dL (ref >39.00)
LDL Cholesterol: 94 mg/dL (ref 0–99)
NonHDL: 105.87
Total CHOL/HDL Ratio: 2
Triglycerides: 58 mg/dL (ref 0.0–149.0)
VLDL: 11.6 mg/dL (ref 0.0–40.0)

## 2023-03-23 LAB — VITAMIN B12: Vitamin B-12: 348 pg/mL (ref 211–911)

## 2023-03-23 NOTE — Assessment & Plan Note (Signed)
Checking TSH and free T4 and adjust synthroid 125 mcg daily as needed. Some new cognitive changes.

## 2023-03-23 NOTE — Assessment & Plan Note (Signed)
Still having some and using otc as needed for migraines.

## 2023-03-23 NOTE — Assessment & Plan Note (Signed)
Tetanus up to date. Colonoscopy up to date. Mammogram up to date, pap smear up to date. Counseled about sun safety and mole surveillance. Counseled about the dangers of distracted driving. Given 10 year screening recommendations.

## 2023-03-23 NOTE — Assessment & Plan Note (Signed)
More than 3 months out from covid-19 and still with cognitive changes. Checking TSH, free T4, B12, vitamin D, MRI brain to assess. Treat as appropriate. Counseled to try meditation to see if this can help 5-10 minutes 2-3 times per week.

## 2023-03-23 NOTE — Progress Notes (Signed)
   Subjective:   Patient ID: Joanna Thompson, female    DOB: [birth year], 47 y.o.   MRN: [id number]  HPI The patient is here for physical. Covid in Jan and still having moderate cognitive changes.   PMH, [facility], social history reviewed and updated  Review of Systems  Constitutional: Negative.   HENT: Negative.    Eyes: Negative.   Respiratory:  Negative for cough, chest tightness and shortness of breath.   Cardiovascular:  Negative for chest pain, palpitations and leg swelling.  Gastrointestinal:  Negative for abdominal distention, abdominal pain, constipation, diarrhea, nausea and vomiting.  Musculoskeletal: Negative.   Skin: Negative.   Neurological: Negative.   Psychiatric/Behavioral:  Positive for decreased concentration.     Objective:  Physical Exam Constitutional:      Appearance: She is well-developed.  HENT:     Head: Normocephalic and atraumatic.  Cardiovascular:     Rate and Rhythm: Normal rate and regular rhythm.  Pulmonary:     Effort: Pulmonary effort is normal. No respiratory distress.     Breath sounds: Normal breath sounds. No wheezing or rales.  Abdominal:     General: Bowel sounds are normal. There is no distension.     Palpations: Abdomen is soft.     Tenderness: There is no abdominal tenderness. There is no rebound.  Musculoskeletal:     Cervical back: Normal range of motion.  Skin:    General: Skin is warm and dry.  Neurological:     Mental Status: She is alert and oriented to person, place, and time.     Coordination: Coordination normal.     Vitals:   03/23/23 0823  BP: 116/80  Pulse: 63  Temp: 98.1 F (36.7 C)  TempSrc: Oral  SpO2: 99%  Weight: 137 lb (62.1 kg)  Height: 5' 4.57" (1.64 m)    Assessment & Plan:

## 2023-04-16 ENCOUNTER — Ambulatory Visit
Admission: RE | Admit: 2023-04-16 | Discharge: 2023-04-16 | Disposition: A | Discharge status: Home / Self Care | Payer: 59 | Source: Ambulatory Visit | Attending: Internal Medicine | Admitting: Internal Medicine

## 2023-04-16 DIAGNOSIS — R41840 Attention and concentration deficit: Secondary | ICD-10-CM

## 2023-04-21 ENCOUNTER — Other Ambulatory Visit: Payer: Self-pay | Admitting: Internal Medicine

## 2023-04-21 DIAGNOSIS — R930 Abnormal findings on diagnostic imaging of skull and head, not elsewhere classified: Secondary | ICD-10-CM

## 2023-05-04 ENCOUNTER — Ambulatory Visit: Payer: 59 | Admitting: Internal Medicine

## 2023-05-04 ENCOUNTER — Encounter: Payer: Self-pay | Admitting: Internal Medicine

## 2023-05-04 VITALS — BP 112/80 | HR 58 | Temp 98.3°F | Ht 64.47 in | Wt 138.0 lb

## 2023-05-04 DIAGNOSIS — R635 Abnormal weight gain: Secondary | ICD-10-CM | POA: Insufficient documentation

## 2023-05-04 LAB — CBC WITH DIFFERENTIAL/PLATELET
Basophils Absolute: 0 10*3/uL (ref 0.0–0.1)
Basophils Relative: 0.6 % (ref 0.0–3.0)
Eosinophils Absolute: 0.3 10*3/uL (ref 0.0–0.7)
Eosinophils Relative: 5.5 % — ABNORMAL HIGH (ref 0.0–5.0)
HCT: 42.8 % (ref 36.0–46.0)
Hemoglobin: 14.3 g/dL (ref 12.0–15.0)
Lymphocytes Relative: 27 % (ref 12.0–46.0)
Lymphs Abs: 1.5 10*3/uL (ref 0.7–4.0)
MCHC: 33.3 g/dL (ref 30.0–36.0)
MCV: 90.8 fl (ref 78.0–100.0)
Monocytes Absolute: 0.4 10*3/uL (ref 0.1–1.0)
Monocytes Relative: 7.7 % (ref 3.0–12.0)
Neutro Abs: 3.3 10*3/uL (ref 1.4–7.7)
Neutrophils Relative %: 59.2 % (ref 43.0–77.0)
Platelets: 223 10*3/uL (ref 150.0–400.0)
RBC: 4.71 Mil/uL (ref 3.87–5.11)
RDW: 12.8 % (ref 11.5–15.5)
WBC: 5.5 10*3/uL (ref 4.0–10.5)

## 2023-05-04 LAB — COMPREHENSIVE METABOLIC PANEL
ALT: 13 U/L (ref 0–35)
AST: 18 U/L (ref 0–37)
Albumin: 4.5 g/dL (ref 3.5–5.2)
Alkaline Phosphatase: 35 U/L — ABNORMAL LOW (ref 39–117)
BUN: 13 mg/dL (ref 6–23)
CO2: 27 mEq/L (ref 19–32)
Calcium: 9.4 mg/dL (ref 8.4–10.5)
Chloride: 103 mEq/L (ref 96–112)
Creatinine, Ser: 0.92 mg/dL (ref 0.40–1.20)
GFR: 74.82 mL/min (ref >60.00)
Glucose, Bld: 97 mg/dL (ref 70–99)
Potassium: 4 mEq/L (ref 3.5–5.1)
Sodium: 136 mEq/L (ref 135–145)
Total Bilirubin: 0.8 mg/dL (ref 0.2–1.2)
Total Protein: 7.3 g/dL (ref 6.0–8.3)

## 2023-05-04 LAB — HEMOGLOBIN A1C: Hgb A1c MFr Bld: 5.2 % (ref 4.6–6.5)

## 2023-05-04 LAB — TSH: TSH: 5.68 u[IU]/mL — ABNORMAL HIGH (ref 0.35–5.50)

## 2023-05-04 LAB — T4, FREE: Free T4: 0.9 ng/dL (ref 0.60–1.60)

## 2023-05-04 LAB — FOLLICLE STIMULATING HORMONE: FSH: 3.6 m[IU]/mL

## 2023-05-04 NOTE — Progress Notes (Signed)
   Subjective:   Patient ID: Joanna Thompson, female    DOB: 01/25/1976, 47 y.o.   MRN: [id number]  HPI The patient is a 47 YO female coming in for 10 pound weight gain in 1 month.   Review of Systems  Constitutional:  Positive for unexpected weight change.  HENT: Negative.    Eyes: Negative.   Respiratory:  Negative for cough, chest tightness and shortness of breath.   Cardiovascular:  Negative for chest pain, palpitations and leg swelling.  Gastrointestinal:  Negative for abdominal distention, abdominal pain, constipation, diarrhea, nausea and vomiting.  Musculoskeletal: Negative.   Skin: Negative.   Neurological: Negative.   Psychiatric/Behavioral: Negative.      Objective:  Physical Exam Constitutional:      Appearance: She is well-developed.  HENT:     Head: Normocephalic and atraumatic.  Cardiovascular:     Rate and Rhythm: Normal rate and regular rhythm.  Pulmonary:     Effort: Pulmonary effort is normal. No respiratory distress.     Breath sounds: Normal breath sounds. No wheezing or rales.  Abdominal:     General: Bowel sounds are normal. There is no distension.     Palpations: Abdomen is soft.     Tenderness: There is no abdominal tenderness. There is no rebound.  Musculoskeletal:     Cervical back: Normal range of motion.  Skin:    General: Skin is warm and dry.  Neurological:     Mental Status: She is alert and oriented to person, place, and time.     Coordination: Coordination normal.     Vitals:   05/04/23 0822  BP: 112/80  Pulse: (!) 58  Temp: 98.3 F (36.8 C)  TempSrc: Oral  SpO2: 98%  Weight: 138 lb (62.6 kg)  Height: 5' 4.47" (1.638 m)    Assessment & Plan:

## 2023-05-04 NOTE — Assessment & Plan Note (Signed)
She is about 1 pound higher than visit 03/23/23 and she states that she has not gone down significantly since that visit. She is up about 10 pounds since visit 2023. Checking FSH, T4, TSH, HgA1c, CBC, CMP.

## 2023-05-04 NOTE — Patient Instructions (Signed)
We will check the labs today. 

## 2023-05-07 ENCOUNTER — Other Ambulatory Visit: Payer: Self-pay | Admitting: Internal Medicine

## 2023-05-07 DIAGNOSIS — E063 Autoimmune thyroiditis: Secondary | ICD-10-CM

## 2023-05-07 MED ORDER — LEVOTHYROXINE SODIUM 150 MCG PO TABS: 150.0000 ug | ORAL_TABLET | Freq: Every day | ORAL | 3 refills | Status: AC

## 2023-06-09 ENCOUNTER — Encounter: Payer: Self-pay | Admitting: Internal Medicine

## 2023-06-29 ENCOUNTER — Ambulatory Visit
Admission: RE | Admit: 2023-06-29 | Discharge: 2023-06-29 | Disposition: A | Discharge status: Home / Self Care | Payer: 59 | Source: Ambulatory Visit | Attending: Internal Medicine | Admitting: Internal Medicine

## 2023-06-29 DIAGNOSIS — R930 Abnormal findings on diagnostic imaging of skull and head, not elsewhere classified: Secondary | ICD-10-CM

## 2023-06-29 MED ORDER — GADOPICLENOL 0.5 MMOL/ML IV SOLN
6.0000 mL | Freq: Once | INTRAVENOUS | Status: AC | PRN
  Administered 2023-06-29: 6 mL via INTRAVENOUS

## 2023-08-19 ENCOUNTER — Ambulatory Visit: Payer: 59 | Admitting: Obstetrics & Gynecology

## 2024-03-07 ENCOUNTER — Encounter (HOSPITAL_BASED_OUTPATIENT_CLINIC_OR_DEPARTMENT_OTHER): Payer: Self-pay | Admitting: Student

## 2024-03-07 ENCOUNTER — Ambulatory Visit (HOSPITAL_BASED_OUTPATIENT_CLINIC_OR_DEPARTMENT_OTHER): Admitting: Student

## 2024-03-07 ENCOUNTER — Ambulatory Visit (HOSPITAL_BASED_OUTPATIENT_CLINIC_OR_DEPARTMENT_OTHER)

## 2024-03-07 DIAGNOSIS — M25532 Pain in left wrist: Secondary | ICD-10-CM

## 2024-03-07 DIAGNOSIS — G5602 Carpal tunnel syndrome, left upper limb: Secondary | ICD-10-CM | POA: Diagnosis not present

## 2024-03-07 NOTE — Progress Notes (Signed)
 Chief Complaint: Left wrist pain    Discussed the use of AI scribe software for clinical note transcription with the patient, who gave verbal consent to proceed.  History of Present Illness Joanna Thompson is a 48 year old female presenting with numbness in the fingers of the right hand, which started after operating a boat motor for many hours this past weekend. The numbness began approximately six hours after the activity and was severe enough to wake the patient from sleep. The patient describes the sensation as similar to having a tourniquet applied. The numbness is most severe in the index finger and extends to the middle finger and thumb. There is also some numbness in the pinky finger. The patient has been using a wrist brace, which provides some relief. The patient has no pain in the wrist or neck area. The patient has not experienced similar symptoms in the past.   Surgical History:   None  PMH/PSH/Family History/Social History/Meds/Allergies:    Past Medical History:  Diagnosis Date   ADHD (attention deficit hyperactivity disorder)    Anemia, iron deficiency    Generalized headaches    GERD (gastroesophageal reflux disease)    H/O bulimia nervosa    H/O bulimia nervosa    History of chicken pox 1984   Hypothyroidism    Melanoma in situ (HCC) 2008   anterior abd - follows with derm annually   Migraine    Missed abortion    Past Surgical History:  Procedure Laterality Date   AUGMENTATION MAMMAPLASTY Bilateral 2006   PLACEMENT OF BREAST IMPLANTS     RHINOPLASTY     TONSILLECTOMY AND ADENOIDECTOMY     WISDOM TOOTH EXTRACTION     Social History   Socioeconomic History   Marital status: Married    Spouse name: Not on file   Number of children: 1   Years of education: Not on file   Highest education level: Not on file  Occupational History   Occupation: stay at home mom   Occupation: NP  Tobacco Use   Smoking status: Never   Smokeless  tobacco: Never  Vaping Use   Vaping status: Never Used  Substance and Sexual Activity   Alcohol use: Yes    Comment: 7 glasses of wine a week    Drug use: No   Sexual activity: Yes    Partners: Male    Birth control/protection: I.U.D.    Comment: 1st intercourse- 18, partners- 1  Other Topics Concern   Not on file  Social History Narrative   Not on file   Social Drivers of Health   Financial Resource Strain: Not on file  Food Insecurity: Not on file  Transportation Needs: Not on file  Physical Activity: Not on file  Stress: Not on file  Social Connections: Not on file   Family History  Problem Relation Age of Onset   Colon cancer Maternal Grandmother 39   Thyroid cancer Father    Cancer Father        thyroid    Colon polyps Paternal Grandmother    Allergies  Allergen Reactions   Analgesia [Trolamine (Triethanolamine)] Other (See Comments)    Gets very nauseated with anesthesia   Codeine Nausea And Vomiting   Influenza Vaccines Hives and Swelling   Betadine [Povidone Iodine] Rash   Current Outpatient Medications  Medication Sig Dispense Refill   amphetamine-dextroamphetamine (ADDERALL XR) 25 MG 24 hr capsule Take 25 mg by mouth daily.     BIOTIN PO Take by mouth daily.     Eszopiclone 3 MG TABS TAKE ONE TABLET BY MOUTH EVERY NIGHT AIMMEDIATELY BEFORE BEDTIME 30 tablet 4   FLUoxetine (PROZAC) 40 MG capsule Take 40 mg by mouth daily.     hydrOXYzine (ATARAX) 10 MG tablet Take 10-20 mg by mouth at bedtime.     levonorgestrel (MIRENA) 20 MCG/24HR IUD 1 each by Intrauterine route once.     levothyroxine (SYNTHROID) 150 MCG tablet Take 1 tablet (150 mcg total) by mouth daily. 90 tablet 3   Multiple Vitamin (MULTIVITAMIN) tablet Take 1 tablet by mouth daily.     No current facility-administered medications for this visit.   No results found.  Review of Systems:   A ROS was performed including pertinent positives and negatives as documented in the HPI.  Physical Exam  :   Constitutional: NAD and appears stated age Neurological: Alert and oriented Psych: Appropriate affect and cooperative There were no vitals taken for this visit.   Comprehensive Musculoskeletal Exam:    Exam of the left wrist and hand demonstrates no obvious abnormality.  Tenderness with palpation in the thenar eminence of the thumb.  Positive Tinel's test at the wrist.  Grip strength 4/5 compared to 5/5 on contralateral side.  Full active wrist range of motion with flexion, extension, and bilateral deviation.  Imaging:   Xray (left wrist 4 views): Negative for bony abnormality   I personally reviewed and interpreted the radiographs.      Assessment & Plan Carpal Tunnel Syndrome   Acute carpal tunnel syndrome likely results from overuse. Symptoms should resolve with rest and conservative management. Recommend wearing a carpal tunnel brace and using anti-inflammatory medications like ibuprofen or naproxen, with meloxicam as an alternative. Consider a steroid injection if symptoms persist beyond the weekend. Advise rest and avoidance of activities that exacerbate symptoms. Follow up if there is no improvement within a week.       I personally saw and evaluated the patient, and participated in the management and treatment plan.  Sharrell Deck, PA-C Orthopedics

## 2024-09-25 ENCOUNTER — Encounter: Payer: Self-pay | Admitting: Radiology

## 2024-11-10 ENCOUNTER — Encounter: Payer: Self-pay | Admitting: Obstetrics and Gynecology

## 2024-11-10 ENCOUNTER — Ambulatory Visit: Admitting: Obstetrics and Gynecology

## 2024-11-10 VITALS — BP 122/80 | HR 70 | Temp 98.4°F | Ht 65.0 in | Wt 135.0 lb

## 2024-11-10 DIAGNOSIS — Z30431 Encounter for routine checking of intrauterine contraceptive device: Secondary | ICD-10-CM | POA: Diagnosis not present

## 2024-11-10 NOTE — Progress Notes (Signed)
 "  48 y.o. G59P1001 female with Mirena  IUD x 05/10/2019 here for problem visit. Married.  No LMP recorded. (Menstrual status: IUD).  She reports she is longer able to feel her strings. Amenorrheic with IUD No pelvic pain or cramping  OB History  Gravida Para Term Preterm AB Living  1 1 1   1   SAB IAB Ectopic Multiple Live Births          # Outcome Date GA Lbr Len/2nd Weight Sex Type Anes PTL Lv  1 Term            Past Medical History:  Diagnosis Date   ADHD (attention deficit hyperactivity disorder)    Anemia, iron deficiency    Generalized headaches    GERD (gastroesophageal reflux disease)    H/O bulimia nervosa    H/O bulimia nervosa    History of chicken pox 1984   Hypothyroidism    Melanoma in situ (HCC) 2008   anterior abd - follows with derm annually   Migraine    Missed abortion    Past Surgical History:  Procedure Laterality Date   AUGMENTATION MAMMAPLASTY Bilateral 2006   PLACEMENT OF BREAST IMPLANTS     RHINOPLASTY     TONSILLECTOMY AND ADENOIDECTOMY     WISDOM TOOTH EXTRACTION     Medications Ordered Prior to Encounter[1] Allergies[2]    PE Today's Vitals   11/10/24 1050  BP: 122/80  Pulse: 70  Temp: 98.4 F (36.9 C)  TempSrc: Oral  SpO2: 99%  Weight: 135 lb (61.2 kg)  Height: 5' 5 (1.651 m)   Body mass index is 22.47 kg/m.  Physical Exam Vitals reviewed. Exam conducted with a chaperone present.  Constitutional:      General: She is not in acute distress.    Appearance: Normal appearance.  HENT:     Head: Normocephalic and atraumatic.     Nose: Nose normal.  Eyes:     Extraocular Movements: Extraocular movements intact.     Conjunctiva/sclera: Conjunctivae normal.  Pulmonary:     Effort: Pulmonary effort is normal.  Genitourinary:    General: Normal vulva.     Exam position: Lithotomy position.     Vagina: Normal. No vaginal discharge.     Cervix: Normal. No cervical motion tenderness, discharge or lesion.     Uterus: Normal.  Not enlarged and not tender.      Adnexa: Right adnexa normal and left adnexa normal.     Comments: IUD strings present, ~1cm in length Musculoskeletal:        General: Normal range of motion.     Cervical back: Normal range of motion.  Neurological:     General: No focal deficit present.     Mental Status: She is alert.  Psychiatric:        Mood and Affect: Mood normal.        Behavior: Behavior normal.      Assessment and Plan:        IUD check up  Normal string check, continue IUD for up to 8 yr F/u for annual exam as scheduled  Vera LULLA Pa, MD     [1]  Current Outpatient Medications on File Prior to Visit  Medication Sig Dispense Refill   amphetamine-dextroamphetamine (ADDERALL XR) 25 MG 24 hr capsule Take 25 mg by mouth daily.     BIOTIN PO Take by mouth daily.     Eszopiclone  3 MG TABS TAKE ONE TABLET BY MOUTH EVERY NIGHT AIMMEDIATELY BEFORE BEDTIME  30 tablet 4   FLUoxetine (PROZAC) 40 MG capsule Take 40 mg by mouth daily.     levonorgestrel  (MIRENA ) 20 MCG/24HR IUD 1 each by Intrauterine route once.     levothyroxine  (SYNTHROID ) 150 MCG tablet Take 1 tablet (150 mcg total) by mouth daily. 90 tablet 3   Multiple Vitamin (MULTIVITAMIN) tablet Take 1 tablet by mouth daily.     hydrOXYzine (ATARAX) 10 MG tablet Take 10-20 mg by mouth at bedtime. (Patient not taking: Reported on 11/10/2024)     No current facility-administered medications on file prior to visit.  [2]  Allergies Allergen Reactions   Analgesia [Trolamine (Triethanolamine)] Other (See Comments)    Gets very nauseated with anesthesia   Codeine Nausea And Vomiting   Influenza Vaccines Hives and Swelling   Betadine [Povidone Iodine] Rash   "

## 2024-11-28 ENCOUNTER — Ambulatory Visit: Admitting: Internal Medicine

## 2024-11-28 ENCOUNTER — Ambulatory Visit
Admission: RE | Admit: 2024-11-28 | Discharge: 2024-11-28 | Disposition: A | Source: Ambulatory Visit | Attending: Obstetrics and Gynecology

## 2024-11-28 ENCOUNTER — Telehealth: Payer: Self-pay

## 2024-11-28 ENCOUNTER — Other Ambulatory Visit: Payer: Self-pay | Admitting: Internal Medicine

## 2024-11-28 ENCOUNTER — Encounter: Payer: Self-pay | Admitting: Internal Medicine

## 2024-11-28 VITALS — BP 108/72 | HR 58 | Temp 98.2°F | Ht 65.0 in | Wt 137.0 lb

## 2024-11-28 DIAGNOSIS — Z0000 Encounter for general adult medical examination without abnormal findings: Secondary | ICD-10-CM | POA: Diagnosis not present

## 2024-11-28 DIAGNOSIS — Z1231 Encounter for screening mammogram for malignant neoplasm of breast: Secondary | ICD-10-CM

## 2024-11-28 DIAGNOSIS — E063 Autoimmune thyroiditis: Secondary | ICD-10-CM

## 2024-11-28 DIAGNOSIS — F909 Attention-deficit hyperactivity disorder, unspecified type: Secondary | ICD-10-CM | POA: Diagnosis not present

## 2024-11-28 LAB — CBC
HCT: 38.2 % (ref 36.0–46.0)
Hemoglobin: 13 g/dL (ref 12.0–15.0)
MCHC: 33.9 g/dL (ref 30.0–36.0)
MCV: 91 fl (ref 78.0–100.0)
Platelets: 188 K/uL (ref 150.0–400.0)
RBC: 4.2 Mil/uL (ref 3.87–5.11)
RDW: 13.4 % (ref 11.5–15.5)
WBC: 5.3 K/uL (ref 4.0–10.5)

## 2024-11-28 LAB — COMPREHENSIVE METABOLIC PANEL WITH GFR
ALT: 10 U/L (ref 3–35)
AST: 14 U/L (ref 5–37)
Albumin: 4 g/dL (ref 3.5–5.2)
Alkaline Phosphatase: 29 U/L — ABNORMAL LOW (ref 39–117)
BUN: 22 mg/dL (ref 6–23)
CO2: 28 meq/L (ref 19–32)
Calcium: 8.6 mg/dL (ref 8.4–10.5)
Chloride: 106 meq/L (ref 96–112)
Creatinine, Ser: 0.91 mg/dL (ref 0.40–1.20)
GFR: 74.97 mL/min
Glucose, Bld: 84 mg/dL (ref 70–99)
Potassium: 4.2 meq/L (ref 3.5–5.1)
Sodium: 138 meq/L (ref 135–145)
Total Bilirubin: 0.6 mg/dL (ref 0.2–1.2)
Total Protein: 6.2 g/dL (ref 6.0–8.3)

## 2024-11-28 LAB — LIPID PANEL
Cholesterol: 192 mg/dL (ref 28–200)
HDL: 74.8 mg/dL
LDL Cholesterol: 106 mg/dL — ABNORMAL HIGH (ref 10–99)
NonHDL: 117.14
Total CHOL/HDL Ratio: 3
Triglycerides: 54 mg/dL (ref 10.0–149.0)
VLDL: 10.8 mg/dL (ref 0.0–40.0)

## 2024-11-28 LAB — VITAMIN B12: Vitamin B-12: 503 pg/mL (ref 211–911)

## 2024-11-28 LAB — VITAMIN D 25 HYDROXY (VIT D DEFICIENCY, FRACTURES): VITD: 26.19 ng/mL — ABNORMAL LOW (ref 30.00–100.00)

## 2024-11-28 NOTE — Assessment & Plan Note (Signed)
 Flu shot declines. Tetanus up to date. Colonoscopy due counseled to call and schedule. Mammogram getting soon, pap smear up to date. Counseled about sun safety and mole surveillance. Counseled about the dangers of distracted driving. Given 10 year screening recommendations.

## 2024-11-28 NOTE — Assessment & Plan Note (Signed)
 Well controlled and seeing [place] attention specialist. On prozac and adderall.

## 2024-11-28 NOTE — Progress Notes (Signed)
" ° °  Subjective:   Patient ID: Joanna Thompson, female    DOB: 1976/04/07, 49 y.o.   MRN: [id number]  The patient is here for physical. Pertinent topics discussed: Discussed the use of AI scribe software for clinical note transcription with the patient, who gave verbal consent to proceed.  History of Present Illness Joanna Thompson is a 49 year old female who presents for a routine check-up and evaluation of perimenopausal symptoms.  She reports irregular symptoms, mood swings, weight changes, sleep disturbances, and hot flashes. She is managing these symptoms and is scheduled for a mammogram today and a GYN annual exam by the end of the month.  Her thyroid  levels are being monitored, with an upcoming appointment in two weeks for blood work. She has been alternating between 137 mcg and 150 mcg of her thyroid  medication.  She has persistent constipation, for which she takes three senna tablets daily to maintain regular bowel movements. She notes increasing sensitivity to gluten and dairy, as well as almonds, which cause gastrointestinal discomfort and a sensation of needing to use the bathroom frequently.  She is physically active and exercises regularly. Her last colon cancer screening was in 2020; she recalls that no issues were found at that time. She mentions a darker skin patch that developed over the summer but denies any new skin cancers. No chest pain, breathing problems, or smoking.  PMH, [facility], social history reviewed and updated  Review of Systems  Constitutional: Negative.   HENT: Negative.    Eyes: Negative.   Respiratory:  Negative for cough, chest tightness and shortness of breath.   Cardiovascular:  Negative for chest pain, palpitations and leg swelling.  Gastrointestinal:  Negative for abdominal distention, abdominal pain, constipation, diarrhea, nausea and vomiting.  Musculoskeletal: Negative.   Skin: Negative.   Neurological: Negative.   Psychiatric/Behavioral: Negative.       Objective:  Physical Exam Constitutional:      Appearance: She is well-developed.  HENT:     Head: Normocephalic and atraumatic.  Cardiovascular:     Rate and Rhythm: Normal rate and regular rhythm.  Pulmonary:     Effort: Pulmonary effort is normal. No respiratory distress.     Breath sounds: Normal breath sounds. No wheezing or rales.  Abdominal:     General: Bowel sounds are normal. There is no distension.     Palpations: Abdomen is soft.     Tenderness: There is no abdominal tenderness.  Musculoskeletal:     Cervical back: Normal range of motion.  Skin:    General: Skin is warm and dry.  Neurological:     Mental Status: She is alert and oriented to person, place, and time.     Coordination: Coordination normal.     Vitals:   11/28/24 0907  BP: 108/72  Pulse: (!) 58  Temp: 98.2 F (36.8 C)  TempSrc: Oral  SpO2: 99%  Weight: 137 lb (62.1 kg)  Height: 5' 5 (1.651 m)    Assessment & Plan:   "

## 2024-11-28 NOTE — Assessment & Plan Note (Signed)
 Seeing endo for thyroid  levels and management.

## 2024-11-28 NOTE — Telephone Encounter (Signed)
 Attempted to reach patient concerning colonoscopy recall; unable to speak with patient;  left message and number to the office for patient to call back and schedule appts;   It appears the patient may be followed by [facility]; will confirm with patient when office is in touch with patient;

## 2024-11-30 ENCOUNTER — Ambulatory Visit: Payer: Self-pay | Admitting: Internal Medicine

## 2024-12-01 ENCOUNTER — Other Ambulatory Visit: Payer: Self-pay | Admitting: Medical Genetics

## 2024-12-01 ENCOUNTER — Ambulatory Visit: Payer: Self-pay | Admitting: Internal Medicine

## 2024-12-01 NOTE — Progress Notes (Signed)
 Pt is aware through MyChart message.

## 2024-12-14 ENCOUNTER — Other Ambulatory Visit

## 2024-12-14 DIAGNOSIS — Z006 Encounter for examination for normal comparison and control in clinical research program: Secondary | ICD-10-CM

## 2024-12-22 ENCOUNTER — Ambulatory Visit: Admitting: Obstetrics and Gynecology

## 2024-12-22 LAB — GENECONNECT MOLECULAR SCREEN: Genetic Analysis Overall Interpretation: NEGATIVE
